# Patient Record
Sex: Male | Born: 1980 | Race: White | Hispanic: No | Marital: Married | Smoking: Never smoker
Health system: Southern US, Community
[De-identification: ages and names within clinical notes are randomized; demographics above are authoritative.]

## PROBLEM LIST (undated history)

## (undated) DIAGNOSIS — T7840XA Allergy, unspecified, initial encounter: Secondary | ICD-10-CM

## (undated) DIAGNOSIS — J4540 Moderate persistent asthma, uncomplicated: Secondary | ICD-10-CM

## (undated) HISTORY — DX: Moderate persistent asthma, uncomplicated: J45.40

## (undated) HISTORY — DX: Allergy, unspecified, initial encounter: T78.40XA

---
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
## 2004-06-28 ENCOUNTER — Emergency Department (HOSPITAL_COMMUNITY): Admission: EM | Admit: 2004-06-28 | Discharge: 2004-06-28 | Payer: Self-pay | Admitting: Emergency Medicine

## 2005-09-10 ENCOUNTER — Emergency Department (HOSPITAL_COMMUNITY): Admission: EM | Admit: 2005-09-10 | Discharge: 2005-09-10 | Payer: Self-pay | Admitting: Emergency Medicine

## 2008-05-25 ENCOUNTER — Emergency Department (HOSPITAL_COMMUNITY): Admission: EM | Admit: 2008-05-25 | Discharge: 2008-05-25 | Payer: Self-pay | Admitting: Emergency Medicine

## 2008-06-12 ENCOUNTER — Ambulatory Visit (HOSPITAL_COMMUNITY): Admission: RE | Admit: 2008-06-12 | Discharge: 2008-06-12 | Payer: Self-pay | Admitting: Gastroenterology

## 2009-08-23 ENCOUNTER — Emergency Department (HOSPITAL_BASED_OUTPATIENT_CLINIC_OR_DEPARTMENT_OTHER): Admission: EM | Admit: 2009-08-23 | Discharge: 2009-08-23 | Payer: Self-pay | Admitting: Emergency Medicine

## 2010-03-25 LAB — RAPID STREP SCREEN (MED CTR MEBANE ONLY): Streptococcus, Group A Screen (Direct): POSITIVE — AB

## 2010-04-19 LAB — DIFFERENTIAL
Basophils Relative: 0 % (ref 0–1)
Lymphs Abs: 1 10*3/uL (ref 0.7–4.0)
Monocytes Absolute: 0.5 10*3/uL (ref 0.1–1.0)
Monocytes Relative: 9 % (ref 3–12)
Neutro Abs: 4.4 10*3/uL (ref 1.7–7.7)

## 2010-04-19 LAB — POCT I-STAT, CHEM 8
BUN: 12 mg/dL (ref 6–23)
Calcium, Ion: 1.19 mmol/L (ref 1.12–1.32)
Chloride: 104 mEq/L (ref 96–112)
Creatinine, Ser: 1.1 mg/dL (ref 0.4–1.5)
Glucose, Bld: 90 mg/dL (ref 70–99)
HCT: 44 % (ref 39.0–52.0)
Potassium: 3.4 mEq/L — ABNORMAL LOW (ref 3.5–5.1)

## 2010-04-19 LAB — HEPATIC FUNCTION PANEL
ALT: 15 U/L (ref 0–53)
AST: 16 U/L (ref 0–37)
Alkaline Phosphatase: 58 U/L (ref 39–117)
Bilirubin, Direct: 0.2 mg/dL (ref 0.0–0.3)
Indirect Bilirubin: 0.7 mg/dL (ref 0.3–0.9)
Total Bilirubin: 0.9 mg/dL (ref 0.3–1.2)

## 2010-04-19 LAB — URINE MICROSCOPIC-ADD ON

## 2010-04-19 LAB — CBC
Hemoglobin: 14.8 g/dL (ref 13.0–17.0)
MCHC: 34.7 g/dL (ref 30.0–36.0)
MCV: 89.1 fL (ref 78.0–100.0)
RBC: 4.8 MIL/uL (ref 4.22–5.81)
WBC: 5.9 10*3/uL (ref 4.0–10.5)

## 2010-04-19 LAB — URINALYSIS, ROUTINE W REFLEX MICROSCOPIC
Ketones, ur: 40 mg/dL — AB
Nitrite: NEGATIVE
Protein, ur: 30 mg/dL — AB

## 2010-09-09 ENCOUNTER — Emergency Department (HOSPITAL_COMMUNITY)
Admission: EM | Admit: 2010-09-09 | Discharge: 2010-09-09 | Disposition: A | Discharge status: Home / Self Care | Payer: Self-pay | Attending: Emergency Medicine | Admitting: Emergency Medicine

## 2010-09-09 DIAGNOSIS — M79609 Pain in unspecified limb: Secondary | ICD-10-CM | POA: Insufficient documentation

## 2010-09-09 DIAGNOSIS — M779 Enthesopathy, unspecified: Secondary | ICD-10-CM | POA: Insufficient documentation

## 2011-02-11 ENCOUNTER — Encounter (HOSPITAL_COMMUNITY): Payer: Self-pay | Admitting: Emergency Medicine

## 2011-02-11 ENCOUNTER — Emergency Department (HOSPITAL_COMMUNITY)
Admission: EM | Admit: 2011-02-11 | Discharge: 2011-02-11 | Disposition: A | Discharge status: Home / Self Care | Payer: Self-pay | Attending: Emergency Medicine | Admitting: Emergency Medicine

## 2011-02-11 DIAGNOSIS — J069 Acute upper respiratory infection, unspecified: Secondary | ICD-10-CM | POA: Insufficient documentation

## 2011-02-11 DIAGNOSIS — J3489 Other specified disorders of nose and nasal sinuses: Secondary | ICD-10-CM | POA: Insufficient documentation

## 2011-02-11 DIAGNOSIS — R05 Cough: Secondary | ICD-10-CM | POA: Insufficient documentation

## 2011-02-11 DIAGNOSIS — R059 Cough, unspecified: Secondary | ICD-10-CM | POA: Insufficient documentation

## 2011-02-11 MED ORDER — ALBUTEROL SULFATE HFA 108 (90 BASE) MCG/ACT IN AERS
4.0000 | INHALATION_SPRAY | Freq: Once | RESPIRATORY_TRACT | Status: AC
  Administered 2011-02-11: 4 via RESPIRATORY_TRACT
  Filled 2011-02-11: qty 6.7

## 2011-02-11 NOTE — ED Notes (Signed)
Patient with week history of URI symptoms.  Now with more congestion, cough and chest congestion, having trouble catching his breath.

## 2011-02-11 NOTE — ED Provider Notes (Signed)
History     CSN: [id number]  Arrival date & time 02/11/11  2103   First MD Initiated Contact with Patient 02/11/11 2153      Chief Complaint  Patient presents with  . Nasal Congestion    (Consider location/radiation/quality/duration/timing/severity/associated sxs/prior treatment) Patient is a 31 y.o. male presenting with URI. The history is provided by the patient.  URI The primary symptoms include cough. Primary symptoms do not include fever, ear pain, sore throat, abdominal pain, nausea, vomiting or rash. The current episode started 6 to 7 days ago. This is a new problem. The problem has been gradually worsening.  The cough is productive. The sputum is yellow. Cough worsened by: cold.  The onset of the illness is associated with exposure to sick contacts. Symptoms associated with the illness include sinus pressure and congestion. The following treatments were addressed: Acetaminophen was ineffective. A decongestant was ineffective.    History reviewed. No pertinent past medical history.  History reviewed. No pertinent past surgical history.  No family history on file.  History  Substance Use Topics  . Smoking status: Never Smoker   . Smokeless tobacco: Not on file  . Alcohol Use: Yes     occasional      Review of Systems  Constitutional: Negative for fever.  HENT: Positive for congestion and sinus pressure. Negative for ear pain, sore throat, facial swelling and trouble swallowing.   Respiratory: Positive for cough and chest tightness. Negative for shortness of breath.   Cardiovascular: Negative for chest pain.  Gastrointestinal: Negative for nausea, vomiting, abdominal pain and diarrhea.  Genitourinary: Negative for difficulty urinating.  Skin: Negative for rash.  All other systems reviewed and are negative.    Allergies  Review of patient's allergies indicates no known allergies.  Home Medications  No current outpatient prescriptions on file.  BP 118/87   Pulse 69  Temp(Src) 98.6 F (37 C) (Oral)  Resp 16  SpO2 96%  Physical Exam  Nursing note and vitals reviewed. Constitutional: He is oriented to person, place, and time. He appears well-developed and well-nourished. No distress.  HENT:  Head: Normocephalic and atraumatic.  Nose: Right sinus exhibits maxillary sinus tenderness. Left sinus exhibits maxillary sinus tenderness.  Mouth/Throat: Oropharynx is clear and moist.  Eyes: Conjunctivae are normal. Pupils are equal, round, and reactive to light. No scleral icterus.  Neck: Normal range of motion. Neck supple.  Cardiovascular: Normal rate, regular rhythm, normal heart sounds and intact distal pulses.   No murmur heard. Pulmonary/Chest: Effort normal and breath sounds normal. No stridor. No respiratory distress. He has no wheezes. He has no rales.  Abdominal: Soft. He exhibits no distension. There is no tenderness.  Musculoskeletal: Normal range of motion. He exhibits no edema.  Neurological: He is alert and oriented to person, place, and time.  Skin: Skin is warm and dry. No rash noted.  Psychiatric: He has a normal mood and affect. His behavior is normal.    ED Course  Procedures (including critical care time)  Labs Reviewed - No data to display No results found.   1. Upper respiratory infection       MDM  31 yo male with one week of URI symptoms and cough.  Worse today with chest tightness while exercising in the cold weather.  Well appearing.  Lungs clear.  No fevers.  Sick contacts.  Given albuterol secondary to chest tightness with improvement in his symptoms.  DC'd home in good condition.  Warnell Forester, MD 02/11/11 [phone]

## 2011-02-12 NOTE — ED Provider Notes (Signed)
I saw and evaluated the patient, reviewed the resident's note and I agree with the findings and plan. Patient with URI symptoms and tightness in the chest due to constriction of the airway. Sats are within normal limits and after an inhaler he is feeling much better. He is well-appearing in feel he is safe to go home with clear breath sounds.  Gwyneth Sprout, MD 02/12/11 [phone]

## 2012-08-02 ENCOUNTER — Emergency Department (HOSPITAL_COMMUNITY)
Admission: EM | Admit: 2012-08-02 | Discharge: 2012-08-02 | Disposition: A | Discharge status: Home / Self Care | Payer: Managed Care, Other (non HMO) | Attending: Emergency Medicine | Admitting: Emergency Medicine

## 2012-08-02 ENCOUNTER — Encounter (HOSPITAL_COMMUNITY): Payer: Self-pay | Admitting: Emergency Medicine

## 2012-08-02 DIAGNOSIS — M79604 Pain in right leg: Secondary | ICD-10-CM

## 2012-08-02 DIAGNOSIS — G8911 Acute pain due to trauma: Secondary | ICD-10-CM | POA: Insufficient documentation

## 2012-08-02 DIAGNOSIS — M79609 Pain in unspecified limb: Secondary | ICD-10-CM | POA: Insufficient documentation

## 2012-08-02 MED ORDER — KETOROLAC TROMETHAMINE 60 MG/2ML IM SOLN
60.0000 mg | Freq: Once | INTRAMUSCULAR | Status: AC
  Administered 2012-08-02: 60 mg via INTRAMUSCULAR
  Filled 2012-08-02: qty 2

## 2012-08-02 NOTE — ED Notes (Signed)
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 Pt was seen by urgent care and chiropractor with no relief. Pt states pain much worse today, does have appt with Ortho Monday [facility])

## 2012-08-02 NOTE — Progress Notes (Signed)
Patient confirms his pcp is the [facility] in [place].  Patient does not see any particular doctor there.

## 2012-08-02 NOTE — ED Provider Notes (Signed)
Medical screening examination/treatment/procedure(s) were performed by non-physician practitioner and as supervising physician I was immediately available for consultation/collaboration.   Tila Millirons, MD 08/02/12 2327 

## 2012-08-02 NOTE — ED Provider Notes (Signed)
CSN: [id number]     Arrival date & time 08/02/12  1958 History    This chart was scribed for Rolan Bucco, MD, by Frederik Pear, ED scribe. The patient was seen in room WTR9/WTR9 and the patient's care was started at 2027.    First MD Initiated Contact with Patient 08/02/12 2027     Chief Complaint  Patient presents with  . Leg Injury   (Consider location/radiation/quality/duration/timing/severity/associated sxs/prior Treatment) The history is provided by the patient and medical records. No language interpreter was used.    HPI Comments: Andrew Freeman is a 32 y.o. 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 He reports the worsening pain is aggravated by positional changes, but changing from a sitting to standing position after more than 10 minutes changes the pain from an 8 to a 6. He denies ecchymosis, bulges, lumps, or masses in the area. He also complains of an antalgic gait. Her reports he was seen by [facility] earlier in the week and was given Robaxin and ibuprofen. He was advised to follow up if the pain continued. He was seen yesterday by a chiropractor, which offered no relief. He reports he has an appointment with an orthopedist in 3 days. Her reports he has treated the pain with ibuprofen at home since his [facility] visit. He reports he came to the ED because he is concerned the pain is not improving, and he is concerned that the pain will be aggravated by his work.  History reviewed. No pertinent past medical history. History reviewed. No pertinent past surgical history. No family history on file. History  Substance Use Topics  . Smoking status: Never Smoker   . Smokeless tobacco: Not on file  . Alcohol Use: Yes     Comment: occasional    Review of Systems A complete 10 system review of systems was obtained  and all systems are negative except as noted in the HPI and PMH.  Allergies  Review of patient's allergies indicates no known allergies.  Home Medications   Current Outpatient Rx  Name  Route  Sig  Dispense  Refill  . ibuprofen (ADVIL,MOTRIN) 200 MG tablet   Oral   Take 800 mg by mouth every 6 (six) hours as needed for pain (pain).         . methocarbamol (ROBAXIN) 750 MG tablet   Oral   Take 750 mg by mouth 4 (four) times daily as needed (muscle pain).          BP 129/90  Pulse 68  Temp(Src) 98.3 F (36.8 C) (Oral)  Resp 16  Ht 6' (1.829 m)  Wt 265 lb (120.203 kg)  BMI 35.93 kg/m2  SpO2 100% Physical Exam  Nursing note and vitals reviewed. Constitutional: He is oriented to person, place, and time. He appears well-developed and well-nourished. No distress.  HENT:  Head: Normocephalic and atraumatic.  Eyes: EOM are normal. Pupils are equal, round, and reactive to light.  Neck: Normal range of motion. Neck supple. No tracheal deviation present.  Cardiovascular: Normal rate, regular rhythm and normal heart sounds.  Exam reveals no gallop and no friction rub.   No murmur heard. Pulmonary/Chest: Effort normal. No respiratory distress. He has no wheezes. He has no rales. He exhibits no tenderness.  Abdominal: Soft. He exhibits no distension.  Musculoskeletal: Normal  range of motion. He exhibits no edema.  Moves all extremities. ROM and strength are 5/5. Pain increased with hamstring flexion. Ambulatory.  Neurological: He is alert and oriented to person, place, and time.  Skin: Skin is warm and dry.  No contusions. No ecchymosis. No masses.  Psychiatric: He has a normal mood and affect. His behavior is normal.    ED Course   Procedures (including critical care time)  DIAGNOSTIC STUDIES: Oxygen Saturation is 100% on room air, normal by my interpretation.    COORDINATION OF CARE:  20:45- Discussed planned course of treatment with the patient, including IV Toradol in  the ED and treatment at home with 800 mg of ibuprofen t.i.d, icing the area, rest, and keeping his appointment with ortho, who is agreeable at this time.  Labs Reviewed - No data to display No results found. 1. Leg pain, right     MDM  Patient with musculoskeletal leg pain. Will discharge to home. Recommend orthopedic followup. Rice therapy. And ibuprofen. Patient is stable and ready for discharge. He is ambulatory.  I personally performed the services described in this documentation, which was scribed in my presence. The recorded information has been reviewed and is accurate.     Roxy Horseman, PA-C 08/02/12 2216

## 2013-07-28 DIAGNOSIS — F32A Depression, unspecified: Secondary | ICD-10-CM | POA: Insufficient documentation

## 2013-07-28 DIAGNOSIS — F419 Anxiety disorder, unspecified: Secondary | ICD-10-CM | POA: Insufficient documentation

## 2015-02-15 ENCOUNTER — Ambulatory Visit (INDEPENDENT_AMBULATORY_CARE_PROVIDER_SITE_OTHER): Payer: Managed Care, Other (non HMO) | Admitting: Allergy and Immunology

## 2015-02-15 ENCOUNTER — Encounter: Payer: Self-pay | Admitting: Allergy and Immunology

## 2015-02-15 VITALS — BP 120/80 | HR 76 | Temp 98.2°F | Resp 16

## 2015-02-15 DIAGNOSIS — J4540 Moderate persistent asthma, uncomplicated: Secondary | ICD-10-CM

## 2015-02-15 DIAGNOSIS — J3089 Other allergic rhinitis: Secondary | ICD-10-CM | POA: Diagnosis not present

## 2015-02-15 DIAGNOSIS — J4541 Moderate persistent asthma with (acute) exacerbation: Secondary | ICD-10-CM | POA: Diagnosis not present

## 2015-02-15 HISTORY — DX: Moderate persistent asthma, uncomplicated: J45.40

## 2015-02-15 MED ORDER — AZELASTINE HCL 0.15 % NA SOLN: 2.0000 | Freq: Every day | NASAL | Status: DC

## 2015-02-15 MED ORDER — ALBUTEROL SULFATE HFA 108 (90 BASE) MCG/ACT IN AERS: 2.0000 | INHALATION_SPRAY | RESPIRATORY_TRACT | Status: AC | PRN

## 2015-02-15 MED ORDER — OLOPATADINE HCL 0.7 % OP SOLN: 1.0000 [drp] | Freq: Every day | OPHTHALMIC | Status: DC

## 2015-02-15 MED ORDER — LEVOCETIRIZINE DIHYDROCHLORIDE 5 MG PO TABS: 5.0000 mg | ORAL_TABLET | Freq: Every evening | ORAL | Status: DC

## 2015-02-15 MED ORDER — BECLOMETHASONE DIPROPIONATE 80 MCG/ACT IN AERS: 2.0000 | INHALATION_SPRAY | Freq: Two times a day (BID) | RESPIRATORY_TRACT | Status: DC

## 2015-02-15 NOTE — Assessment & Plan Note (Signed)
   Continue appropriate allergen avoidance measures.  Restart azelastine, levocetirizine, and olopatadine eyedrops as needed.  Refill prescriptions have been provided.  If allergen avoidance measures and medications fail to adequately relieve symptoms, aeroallergen immunotherapy will be considered.

## 2015-02-15 NOTE — Assessment & Plan Note (Addendum)
   A prescription has been provided for Qvar (beclomethasone) 80 g, 2 inhalations twice a day.  To maximize pulmonary deposition, a spacer has been provided along with instructions for its proper administration with an HFA inhaler.  A prescription has been provided for montelukast 10 mg daily at bedtime.  The patient has been asked to contact me if his symptoms persist or progress. Otherwise, he may return for follow up in 1 month.

## 2015-02-15 NOTE — Progress Notes (Signed)
Follow-up Note  RE: Andrew Freeman MRN: [id number] DOB: 15-Nov-1980 Date of Office Visit: 02/15/2015  Primary care provider: No PCP Per Patient Referring provider: No ref. provider found  History of present illness: HPI Comments: Andrew Freeman is a 35 y.o. male with seasonal and perennial allergic rhinoconjunctivitis who presents today for follow up.  He was most recently seen in this office 05/05/2014.  He reports that approximately 2 months ago he was diagnosed with severe case of bronchitis and treated with antibiotics.  Since that time, he frequently experiences coughing, dyspnea, and wheezing.  The symptoms are triggered by cold air exposure, hot air exposure, and exercise.  He experiences lower respiratory symptoms on a daily basis.  He denies nocturnal awakenings due to lower respiratory symptoms.  He notes that he experiences significant symptom relief when he uses his son's albuterol rescue inhaler.  He reports that he ran out of his prescribed allergy medications and has been experiencing nasal congestion, rhinorrhea, sneezing, and ocular pruritus.   Assessment and plan: Moderate persistent asthma  A prescription has been provided for Qvar (beclomethasone) 80 g, 2 inhalations twice a day.  To maximize pulmonary deposition, a spacer has been provided along with instructions for its proper administration with an HFA inhaler.  A prescription has been provided for montelukast 10 mg daily at bedtime.  The patient has been asked to contact me if his symptoms persist or progress. Otherwise, he may return for follow up in 1 month.  Allergic rhinitis  Continue appropriate allergen avoidance measures.  Restart azelastine, levocetirizine, and olopatadine eyedrops as needed.  Refill prescriptions have been provided.  If allergen avoidance measures and medications fail to adequately relieve symptoms, aeroallergen immunotherapy will be considered.    Meds ordered this  encounter  Medications  . levocetirizine (XYZAL) 5 MG tablet    Sig: Take 1 tablet (5 mg total) by mouth every evening.    Dispense:  30 tablet    Refill:  5  . beclomethasone (QVAR) 80 MCG/ACT inhaler    Sig: Inhale 2 puffs into the lungs 2 (two) times daily.    Dispense:  1 Inhaler    Refill:  4  . Olopatadine HCl (PAZEO) 0.7 % SOLN    Sig: Apply 1 drop to eye daily. Reported on 02/15/2015    Dispense:  1 Bottle    Refill:  5  . Azelastine HCl (ASTEPRO) 0.15 % SOLN    Sig: Place 2 sprays into the nose daily. Reported on 02/15/2015    Dispense:  30 mL    Refill:  4  . albuterol (PROAIR HFA) 108 (90 Base) MCG/ACT inhaler    Sig: Inhale 2 puffs into the lungs every 4 (four) hours as needed for wheezing or shortness of breath.    Dispense:  1 Inhaler    Refill:  1    Diagnositics: Spirometry reveals FVC of 4.33 L and an FEV1 of 3.96 L with 340 mL postbronchodilator improvement.    Physical examination: Blood pressure 120/80, pulse 76, temperature 98.2 F (36.8 C), temperature source Oral, resp. rate 16, SpO2 96 %.  General: Alert, interactive, in no acute distress. HEENT: TMs pearly gray, turbinates edematous and pale with clear discharge, post-pharynx moderately erythematous. Neck: Supple without lymphadenopathy. Lungs: Clear to auscultation without wheezing, rhonchi or rales. CV: Normal S1, S2 without murmurs. Skin: Warm and dry, without lesions or rashes.  The following portions of the patient's history were reviewed and updated as appropriate: allergies, current  medications, past family history, past medical history, past social history, past surgical history and problem list.    Medication List       This list is accurate as of: 02/15/15  5:18 PM.  Always use your most recent med list.               albuterol 108 (90 Base) MCG/ACT inhaler  Commonly known as:  PROAIR HFA  Inhale 2 puffs into the lungs every 4 (four) hours as needed for wheezing or shortness of breath.      Azelastine HCl 0.15 % Soln  Commonly known as:  ASTEPRO  Place 2 sprays into the nose daily. Reported on 02/15/2015     beclomethasone 80 MCG/ACT inhaler  Commonly known as:  QVAR  Inhale 2 puffs into the lungs 2 (two) times daily.     BENADRYL ALLERGY 25 mg capsule  Generic drug:  diphenhydrAMINE  Take 25 mg by mouth every 6 (six) hours as needed.     ibuprofen 200 MG tablet  Commonly known as:  ADVIL,MOTRIN  Take 800 mg by mouth every 6 (six) hours as needed for pain (pain). Reported on 02/15/2015     levocetirizine 5 MG tablet  Commonly known as:  XYZAL  Take 5 mg by mouth every evening. Reported on 02/15/2015     levocetirizine 5 MG tablet  Commonly known as:  XYZAL  Take 1 tablet (5 mg total) by mouth every evening.     methocarbamol 750 MG tablet  Commonly known as:  ROBAXIN  Take 750 mg by mouth 4 (four) times daily as needed (muscle pain). Reported on 02/15/2015     Olopatadine HCl 0.7 % Soln  Commonly known as:  PAZEO  Apply 1 drop to eye daily. Reported on 02/15/2015        No Known Allergies  Review of systems: Constitutional: Negative for fever, chills and weight loss.  HENT: Negative for nosebleeds.   Positive for nasal congestion, rhinorrhea, sneezing. Eyes: Negative for blurred vision.  Positive for ocular pruritus. Respiratory: Negative for hemoptysis.   Positive for coughing, dyspnea, wheezing. Cardiovascular: Negative for chest pain.  Gastrointestinal: Negative for diarrhea and constipation.  Genitourinary: Negative for dysuria.  Musculoskeletal: Negative for myalgias and joint pain.  Neurological: Negative for dizziness.  Endo/Heme/Allergies: Does not bruise/bleed easily.   Past Medical History  Diagnosis Date  . Moderate persistent asthma 02/15/2015    Family History  Problem Relation Age of Onset  . Allergic rhinitis Neg Hx   . Angioedema Neg Hx   . Asthma Neg Hx   . Eczema Neg Hx   . Immunodeficiency Neg Hx   . Urticaria Neg Hx      Social History   Social History  . Marital Status: Married    Spouse Name: N/A  . Number of Children: N/A  . Years of Education: N/A   Occupational History  . Not on file.   Social History Main Topics  . Smoking status: Never Smoker   . Smokeless tobacco: Not on file  . Alcohol Use: Yes     Comment: occasional  . Drug Use: No  . Sexual Activity: Not on file   Other Topics Concern  . Not on file   Social History Narrative    I appreciate the opportunity to take part in this Evan's care. Please do not hesitate to contact me with questions.  Sincerely,   R. Jorene Guest, MD

## 2015-02-15 NOTE — Patient Instructions (Addendum)
Moderate persistent asthma  A prescription has been provided for Qvar (beclomethasone) 80 g, 2 inhalations twice a day.  To maximize pulmonary deposition, a spacer has been provided along with instructions for its proper administration with an HFA inhaler.  A prescription has been provided for montelukast 10 mg daily at bedtime.  The patient has been asked to contact me if his symptoms persist or progress. Otherwise, he may return for follow up in 1 month.  Allergic rhinitis  Continue appropriate allergen avoidance measures.  Restart azelastine, levocetirizine, and olopatadine eyedrops as needed.  Refill prescriptions have been provided.  If allergen avoidance measures and medications fail to adequately relieve symptoms, aeroallergen immunotherapy will be considered.    Return in about 4 weeks (around 03/15/2015), or if symptoms worsen or fail to improve.

## 2016-05-09 ENCOUNTER — Ambulatory Visit: Payer: Managed Care, Other (non HMO) | Admitting: Allergy and Immunology

## 2016-05-09 DIAGNOSIS — J309 Allergic rhinitis, unspecified: Secondary | ICD-10-CM

## 2016-07-03 NOTE — Progress Notes (Signed)
Andrew Freeman is a 36 y.o. male here to Establish Care and Annual Physical.  I acted as a Neurosurgeon for [employer], PA-C Corky Mull, LPN  History of Present Illness:   Chief Complaint  Patient presents with  . Establish Care    BC/BS  . Annual Exam    Acute Concerns: None  Chronic Issues: Asthma -- well controlled, has a flare about once a year, has nebulizer machine at home and may use 1-2 times a year, uses albuterol once or twice a year -- does not need refills Allergy -- claritin daily, well controlled IBS -- has been evaluated in the past by [facility] but was not consistent with follow-up, he took a medication to help with his IBS but it caused "anger issues", he goes to the bathroom 8-10 times a day -- describes BMs as "normal texture and regular sized", no hemorrhoids currently but does have a history of this, no issues with blood from rectum. 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 He does admit that he was taking more of the immodium at one point but is trying to decrease it. He has been taking probiotics. Back pain -- has been told that he has bulging discs, stepped off an ambulance wrong a few years ago and has had problems since, has gotten injections at [facility] and follows up there prn, denies any acute issues with his back presently  Health Maintenance: Immunizations -- up to date Colonoscopy -- no hx of colonoscopy PSA -- no issues with bladder/nocturia Diet -- no fish, but eats all food groups, no fried foods, sweetened tea Caffeine intake -- occasional caffeinated soda, no energy drinks Sleep habits -- naps often due to night shift, does have concerns for sleep study Exercise -- no scheduled exercise Weight -- Weight: 283 lb 6.1 oz (128.5 kg) -- 250-260 lb is where he is more comfortable Mood -- no issues, small bout of depression a long time ago (Paxil)  but denies any currently  Depression screen [facility] 2/9 07/04/2016  Decreased Interest 0  Down, Depressed, Hopeless 0  PHQ - 2 Score 0    No flowsheet data found.  Other providers/specialists: GI doctor -- saw someone through [facility] but he cannot recall who, he was lost to follow-up Orthopedist -- Murphy-Wainer prn for his bulging disc No dentist or eye doctor   Past Medical History:  Diagnosis Date  . Allergy    seasonal allergies  . Moderate persistent asthma 02/15/2015     Social History   Social History  . Marital status: Married    Spouse name: N/A  . Number of children: N/A  . Years of education: N/A   Occupational History  . Not on file.   Social History Main Topics  . Smoking status: Never Smoker  . Smokeless tobacco: Never Used  . Alcohol use Yes     Comment: socially  . Drug use: No  . Sexual activity: Yes   Other Topics Concern  . Not on file   Social History Narrative   EMS    Past Surgical History:  Procedure Laterality Date  . WISDOM TOOTH EXTRACTION  1996    Family History  Problem Relation Age of Onset  . Allergic rhinitis Neg Hx   . Angioedema Neg Hx   . Asthma Neg Hx   . Eczema Neg Hx   .  Immunodeficiency Neg Hx   . Urticaria Neg Hx   . Prostate cancer Neg Hx   . Colon cancer Neg Hx     No Known Allergies   Current Medications:   Current Outpatient Prescriptions:  .  albuterol (PROAIR HFA) 108 (90 Base) MCG/ACT inhaler, Inhale 2 puffs into the lungs every 4 (four) hours as needed for wheezing or shortness of breath., Disp: 1 Inhaler, Rfl: 1 .  diphenhydrAMINE (BENADRYL ALLERGY) 25 mg capsule, Take 25 mg by mouth every 6 (six) hours as needed., Disp: , Rfl:  .  ibuprofen (ADVIL,MOTRIN) 200 MG tablet, Take 800 mg by mouth every 6 (six) hours as needed for pain (pain). Reported on 02/15/2015, Disp: , Rfl:  .  loratadine (CLARITIN) 10 MG tablet, Take 10 mg by mouth as needed for allergies., Disp: , Rfl:  .  Probiotic Product  (PROBIOTIC DAILY PO), Take 1 tablet by mouth daily., Disp: , Rfl:    Review of Systems:   Review of Systems  Constitutional: Negative for chills, fever, malaise/fatigue and weight loss.  HENT: Negative for hearing loss, sinus pain and sore throat.   Eyes: Negative for blurred vision.  Respiratory: Negative for cough and shortness of breath.   Cardiovascular: Negative for chest pain, palpitations and leg swelling.  Gastrointestinal: Negative for abdominal pain, constipation, diarrhea, heartburn, nausea and vomiting.  Genitourinary: Negative for dysuria, frequency and urgency.  Musculoskeletal: Positive for back pain. Negative for myalgias and neck pain.  Skin: Negative for itching and rash.  Neurological: Negative for dizziness, tingling, seizures, loss of consciousness and headaches.  Endo/Heme/Allergies: Negative for polydipsia.  Psychiatric/Behavioral: Negative for depression. The patient is not nervous/anxious.     Vitals:   Vitals:   07/04/16 0943  BP: 140/86  Pulse: 75  Temp: 98.2 F (36.8 C)  TempSrc: Oral  SpO2: 96%  Weight: 283 lb 6.1 oz (128.5 kg)  Height: 5' 11.25" (1.81 m)     Body mass index is 39.25 kg/m.  Physical Exam:   Physical Exam  Constitutional: He appears well-developed. He is cooperative.  Non-toxic appearance. He does not have a sickly appearance. He does not appear ill. No distress.  HENT:  Head: Normocephalic and atraumatic.  Right Ear: Tympanic membrane normal. Tympanic membrane is not erythematous, not retracted and not bulging.  Left Ear: Tympanic membrane normal. Tympanic membrane is not erythematous, not retracted and not bulging.  Nose: Nose normal.  Mouth/Throat: Uvula is midline and oropharynx is clear and moist.  Eyes: Conjunctivae, EOM and lids are normal. Pupils are equal, round, and reactive to light.  Neck: Trachea normal.  Cardiovascular: Normal rate, regular rhythm, S1 normal, S2 normal, normal heart sounds and normal pulses.    Pulmonary/Chest: Effort normal and breath sounds normal.  Abdominal: Normal appearance and bowel sounds are normal. There is no tenderness.  Genitourinary:  Genitourinary Comments: Deferred.  Lymphadenopathy:    He has no cervical adenopathy.  Neurological: He is alert.  Skin: Skin is warm, dry and intact.  Psychiatric: He has a normal mood and affect. His speech is normal and behavior is normal. Thought content normal.  Nursing note and vitals reviewed.   Assessment and Plan:    Fayrene FearingJames was seen today for establish care and annual exam.  Diagnoses and all orders for this visit:  Routine general medical examination at a health care facility Today patient counseled on age appropriate routine health concerns for screening and prevention, each reviewed and up to date or declined. Immunizations reviewed and  up to date or declined. Labs ordered and reviewed. Risk factors for depression reviewed and negative. Hearing function and visual acuity are intact. ADLs screened and addressed as needed. Functional ability and level of safety reviewed and appropriate. Education, counseling and referrals performed based on assessed risks today. Patient provided with a copy of personalized plan for preventive services. -     CBC with Differential/Platelet -     Comprehensive metabolic panel  Obesity (BMI 30-39.9) Discussed diet and exercise. Patient is not really willing to make lifestyle changes at this time. Will check routine labs as well as HgbA1c and TSH. He does have concern for sleep apnea, will order home sleep test. -     Hemoglobin A1c -     TSH -     Home sleep test; Future  Mild intermittent asthma without complication Well-controlled. Albuterol prn. Follow-up if using Albuterol inhaler multiple times a week.  Irritable bowel syndrome, unspecified type Uncontrolled. Continue probiotic. He has tried a few medications in the past but cannot remember what they were. Advised continued  reduction of Immodium as able and increasing fiber in diet. Diet is very high in fat. -     Ambulatory referral to Gastroenterology  Seasonal allergic rhinitis, unspecified trigger Well-controlled. Continue daily Claritin.  Encounter for screening for HIV -     HIV antibody  Encounter for screening for lipid disorder -     Lipid panel  Daytime somnolence Concern for sleep apnea, will order home sleep test. -     Home sleep test; Future  Chronic low back pain Well controlled. Work on weight loss. Murphy-Wainer prn.   . Reviewed expectations re: course of current medical issues. . Discussed self-management of symptoms. . Outlined signs and symptoms indicating need for more acute intervention. . Patient verbalized understanding and all questions were answered. . See orders for this visit as documented in the electronic medical record. . Patient received an After-Visit Summary.  CMA or LPN served as scribe during this visit. History, Physical, and Plan performed by medical provider. Documentation and orders reviewed and attested to.  Jarold Motto, PA-C

## 2016-07-04 ENCOUNTER — Encounter: Payer: Self-pay | Admitting: Gastroenterology

## 2016-07-04 ENCOUNTER — Ambulatory Visit (INDEPENDENT_AMBULATORY_CARE_PROVIDER_SITE_OTHER): Payer: BLUE CROSS/BLUE SHIELD | Admitting: Physician Assistant

## 2016-07-04 ENCOUNTER — Encounter: Payer: Self-pay | Admitting: Physician Assistant

## 2016-07-04 VITALS — BP 140/86 | HR 75 | Temp 98.2°F | Ht 71.25 in | Wt 283.4 lb

## 2016-07-04 DIAGNOSIS — J4520 Mild intermittent asthma, uncomplicated: Secondary | ICD-10-CM

## 2016-07-04 DIAGNOSIS — Z0000 Encounter for general adult medical examination without abnormal findings: Secondary | ICD-10-CM

## 2016-07-04 DIAGNOSIS — R400 Somnolence: Secondary | ICD-10-CM | POA: Diagnosis not present

## 2016-07-04 DIAGNOSIS — E669 Obesity, unspecified: Secondary | ICD-10-CM | POA: Diagnosis not present

## 2016-07-04 DIAGNOSIS — Z13220 Encounter for screening for lipoid disorders: Secondary | ICD-10-CM

## 2016-07-04 DIAGNOSIS — G8929 Other chronic pain: Secondary | ICD-10-CM

## 2016-07-04 DIAGNOSIS — J302 Other seasonal allergic rhinitis: Secondary | ICD-10-CM

## 2016-07-04 DIAGNOSIS — K589 Irritable bowel syndrome without diarrhea: Secondary | ICD-10-CM | POA: Diagnosis not present

## 2016-07-04 DIAGNOSIS — Z0001 Encounter for general adult medical examination with abnormal findings: Secondary | ICD-10-CM

## 2016-07-04 DIAGNOSIS — M545 Low back pain: Secondary | ICD-10-CM

## 2016-07-04 DIAGNOSIS — Z114 Encounter for screening for human immunodeficiency virus [HIV]: Secondary | ICD-10-CM

## 2016-07-04 LAB — COMPREHENSIVE METABOLIC PANEL
ALT: 32 U/L (ref 0–53)
AST: 22 U/L (ref 0–37)
Albumin: 4.6 g/dL (ref 3.5–5.2)
Alkaline Phosphatase: 61 U/L (ref 39–117)
BUN: 13 mg/dL (ref 6–23)
CALCIUM: 9.9 mg/dL (ref 8.4–10.5)
CHLORIDE: 104 meq/L (ref 96–112)
CO2: 31 meq/L (ref 19–32)
Creatinine, Ser: 1.12 mg/dL (ref 0.40–1.50)
GFR: 79.57 mL/min (ref >60.00)
GLUCOSE: 89 mg/dL (ref 70–99)
POTASSIUM: 4.1 meq/L (ref 3.5–5.1)
Sodium: 141 mEq/L (ref 135–145)
Total Bilirubin: 0.5 mg/dL (ref 0.2–1.2)
Total Protein: 7.6 g/dL (ref 6.0–8.3)

## 2016-07-04 LAB — CBC WITH DIFFERENTIAL/PLATELET
BASOS PCT: 0.7 % (ref 0.0–3.0)
Basophils Absolute: 0.1 10*3/uL (ref 0.0–0.1)
Eosinophils Absolute: 0.2 10*3/uL (ref 0.0–0.7)
Eosinophils Relative: 2.6 % (ref 0.0–5.0)
HEMATOCRIT: 41.9 % (ref 39.0–52.0)
Hemoglobin: 14.4 g/dL (ref 13.0–17.0)
LYMPHS ABS: 2.1 10*3/uL (ref 0.7–4.0)
LYMPHS PCT: 29.2 % (ref 12.0–46.0)
MCHC: 34.4 g/dL (ref 30.0–36.0)
MCV: 86.8 fl (ref 78.0–100.0)
MONOS PCT: 9 % (ref 3.0–12.0)
Monocytes Absolute: 0.7 10*3/uL (ref 0.1–1.0)
NEUTROS ABS: 4.3 10*3/uL (ref 1.4–7.7)
NEUTROS PCT: 58.5 % (ref 43.0–77.0)
PLATELETS: 304 10*3/uL (ref 150.0–400.0)
RBC: 4.82 Mil/uL (ref 4.22–5.81)
RDW: 14.5 % (ref 11.5–15.5)
WBC: 7.3 10*3/uL (ref 4.0–10.5)

## 2016-07-04 LAB — LIPID PANEL
CHOL/HDL RATIO: 4
Cholesterol: 157 mg/dL (ref 0–200)
HDL: 37.8 mg/dL — AB (ref >39.00)
LDL CALC: 100 mg/dL — AB (ref 0–99)
NonHDL: 119.03
TRIGLYCERIDES: 95 mg/dL (ref 0.0–149.0)
VLDL: 19 mg/dL (ref 0.0–40.0)

## 2016-07-04 LAB — HEMOGLOBIN A1C: Hgb A1c MFr Bld: 5.4 % (ref 4.6–6.5)

## 2016-07-04 LAB — TSH: TSH: 2.81 u[IU]/mL (ref 0.35–4.50)

## 2016-07-04 NOTE — Patient Instructions (Signed)
We will call you with your lab results.  You will be contacted about your referral for a sleep study and to see the GI doctor.   Health Maintenance, Male A healthy lifestyle and preventive care is important for your health and wellness. Ask your health care provider about what schedule of regular examinations is right for you. What should I know about weight and diet? Eat a Healthy Diet  Eat plenty of vegetables, fruits, whole grains, low-fat dairy products, and lean protein.  Do not eat a lot of foods high in solid fats, added sugars, or salt.  Maintain a Healthy Weight Regular exercise can help you achieve or maintain a healthy weight. You should:  Do at least 150 minutes of exercise each week. The exercise should increase your heart rate and make you sweat (moderate-intensity exercise).  Do strength-training exercises at least twice a week.  Watch Your Levels of Cholesterol and Blood Lipids  Have your blood tested for lipids and cholesterol every 5 years starting at 34 years of age. If you are at high risk for heart disease, you should start having your blood tested when you are 34 years old. You may need to have your cholesterol levels checked more often if: ? Your lipid or cholesterol levels are high. ? You are older than 34 years of age. ? You are at high risk for heart disease.  What should I know about cancer screening? Many types of cancers can be detected early and may often be prevented. Lung Cancer  You should be screened every year for lung cancer if: ? You are a current smoker who has smoked for at least 30 years. ? You are a former smoker who has quit within the past 15 years.  Talk to your health care provider about your screening options, when you should start screening, and how often you should be screened.  Colorectal Cancer  Routine colorectal cancer screening usually begins at 34 years of age and should be repeated every 5-10 years until you are 34 years  old. You may need to be screened more often if early forms of precancerous polyps or small growths are found. Your health care provider may recommend screening at an earlier age if you have risk factors for colon cancer.  Your health care provider may recommend using home test kits to check for hidden blood in the stool.  A small camera at the end of a tube can be used to examine your colon (sigmoidoscopy or colonoscopy). This checks for the earliest forms of colorectal cancer.  Prostate and Testicular Cancer  Depending on your age and overall health, your health care provider may do certain tests to screen for prostate and testicular cancer.  Talk to your health care provider about any symptoms or concerns you have about testicular or prostate cancer.  Skin Cancer  Check your skin from head to toe regularly.  Tell your health care provider about any new moles or changes in moles, especially if: ? There is a change in a mole's size, shape, or color. ? You have a mole that is larger than a pencil eraser.  Always use sunscreen. Apply sunscreen liberally and repeat throughout the day.  Protect yourself by wearing long sleeves, pants, a wide-brimmed hat, and sunglasses when outside.  What should I know about heart disease, diabetes, and high blood pressure?  If you are 7618-34 years of age, have your blood pressure checked every 3-5 years. If you are 34 years of age  or older, have your blood pressure checked every year. You should have your blood pressure measured twice-once when you are at a hospital or clinic, and once when you are not at a hospital or clinic. Record the average of the two measurements. To check your blood pressure when you are not at a hospital or clinic, you can use: ? An automated blood pressure machine at a pharmacy. ? A home blood pressure monitor.  Talk to your health care provider about your target blood pressure.  If you are between 54-58 years old, ask your  health care provider if you should take aspirin to prevent heart disease.  Have regular diabetes screenings by checking your fasting blood sugar level. ? If you are at a normal weight and have a low risk for diabetes, have this test once every three years after the age of 47. ? If you are overweight and have a high risk for diabetes, consider being tested at a younger age or more often.  A one-time screening for abdominal aortic aneurysm (AAA) by ultrasound is recommended for men aged 9-75 years who are current or former smokers. What should I know about preventing infection? Hepatitis B If you have a higher risk for hepatitis B, you should be screened for this virus. Talk with your health care provider to find out if you are at risk for hepatitis B infection. Hepatitis C Blood testing is recommended for:  Everyone born from [birth year] through 1963.  Anyone with known risk factors for hepatitis C.  Sexually Transmitted Diseases (STDs)  You should be screened each year for STDs including gonorrhea and chlamydia if: ? You are sexually active and are younger than 34 years of age. ? You are older than 34 years of age and your health care provider tells you that you are at risk for this type of infection. ? Your sexual activity has changed since you were last screened and you are at an increased risk for chlamydia or gonorrhea. Ask your health care provider if you are at risk.  Talk with your health care provider about whether you are at high risk of being infected with HIV. Your health care provider may recommend a prescription medicine to help prevent HIV infection.  What else can I do?  Schedule regular health, dental, and eye exams.  Stay current with your vaccines (immunizations).  Do not use any tobacco products, such as cigarettes, chewing tobacco, and e-cigarettes. If you need help quitting, ask your health care provider.  Limit alcohol intake to no more than 2 drinks per day. One  drink equals 12 ounces of beer, 5 ounces of wine, or 1 ounces of hard liquor.  Do not use street drugs.  Do not share needles.  Ask your health care provider for help if you need support or information about quitting drugs.  Tell your health care provider if you often feel depressed.  Tell your health care provider if you have ever been abused or do not feel safe at home. This information is not intended to replace advice given to you by your health care provider. Make sure you discuss any questions you have with your health care provider. Document Released: 06/24/2007 Document Revised: 08/25/2015 Document Reviewed: 09/29/2014 Elsevier Interactive Patient Education  [employer].

## 2016-07-05 LAB — HIV ANTIBODY (ROUTINE TESTING W REFLEX): HIV 1&2 Ab, 4th Generation: NONREACTIVE

## 2016-08-01 ENCOUNTER — Telehealth: Payer: Self-pay | Admitting: Physician Assistant

## 2016-08-01 NOTE — Telephone Encounter (Signed)
[facility] fax [facility] Dr[facility]Spears [facility]

## 2016-08-22 ENCOUNTER — Ambulatory Visit: Payer: BLUE CROSS/BLUE SHIELD | Admitting: Gastroenterology

## 2016-08-22 ENCOUNTER — Other Ambulatory Visit: Payer: Self-pay

## 2016-08-28 ENCOUNTER — Encounter: Payer: Self-pay | Admitting: Physician Assistant

## 2016-09-25 ENCOUNTER — Encounter: Payer: Self-pay | Admitting: Physician Assistant

## 2016-09-25 ENCOUNTER — Ambulatory Visit (INDEPENDENT_AMBULATORY_CARE_PROVIDER_SITE_OTHER): Payer: BLUE CROSS/BLUE SHIELD | Admitting: Physician Assistant

## 2016-09-25 VITALS — BP 120/88 | HR 93 | Temp 98.8°F | Ht 71.25 in | Wt 282.0 lb

## 2016-09-25 DIAGNOSIS — J020 Streptococcal pharyngitis: Secondary | ICD-10-CM

## 2016-09-25 LAB — POCT RAPID STREP A (OFFICE): RAPID STREP A SCREEN: POSITIVE — AB

## 2016-09-25 MED ORDER — AMOXICILLIN 875 MG PO TABS: 875.0000 mg | ORAL_TABLET | Freq: Two times a day (BID) | ORAL | 0 refills | Status: DC

## 2016-09-25 NOTE — Patient Instructions (Signed)
You have been diagnosed with strep throat --> your test was positive in the office today. Start the antibiotic today. I recommend staying out of work for the first 24 hours until antibiotic is in your system.  If you develop worsening fevers or shortness of breath please let us know.   Strep Throat Strep throat is a bacterial infection of the throat. Your health care provider may call the infection tonsillitis or pharyngitis, depending on whether there is swelling in the tonsils or at the back of the throat. Strep throat is most common during the cold months of the year in children who are 87-74 years of age, but it can happen during any season in people of any age. This infection is spread from person to person (contagious) through coughing, sneezing, or close contact. What are the causes? Strep throat is caused by the bacteria called Streptococcus pyogenes. What increases the risk? This condition is more likely to develop in:  People who spend time in crowded places where the infection can spread easily.  People who have close contact with someone who has strep throat.  What are the signs or symptoms? Symptoms of this condition include:  Fever or chills.  Redness, swelling, or pain in the tonsils or throat.  Pain or difficulty when swallowing.  White or yellow spots on the tonsils or throat.  Swollen, tender glands in the neck or under the jaw.  Red rash all over the body (rare).  How is this diagnosed? This condition is diagnosed by performing a rapid strep test or by taking a swab of your throat (throat culture test). Results from a rapid strep test are usually ready in a few minutes, but throat culture test results are available after one or two days. How is this treated? This condition is treated with antibiotic medicine. Follow these instructions at home: Medicines  Take over-the-counter and prescription medicines only as told by your health care provider.  Take your  antibiotic as told by your health care provider. Do not stop taking the antibiotic even if you start to feel better.  Have family members who also have a sore throat or fever tested for strep throat. They may need antibiotics if they have the strep infection. Eating and drinking  Do not share food, drinking cups, or personal items that could cause the infection to spread to other people.  If swallowing is difficult, try eating soft foods until your sore throat feels better.  Drink enough fluid to keep your urine clear or pale yellow. General instructions  Gargle with a salt-water mixture 3-4 times per day or as needed. To make a salt-water mixture, completely dissolve -1 tsp of salt in 1 cup of warm water.  Make sure that all household members wash their hands well.  Get plenty of rest.  Stay home from school or work until you have been taking antibiotics for 24 hours.  Keep all follow-up visits as told by your health care provider. This is important. Contact a health care provider if:  The glands in your neck continue to get bigger.  You develop a rash, cough, or earache.  You cough up a thick liquid that is green, yellow-brown, or bloody.  You have pain or discomfort that does not get better with medicine.  Your problems seem to be getting worse rather than better.  You have a fever. Get help right away if:  You have new symptoms, such as vomiting, severe headache, stiff or painful neck, chest pain,  or shortness of breath.  You have severe throat pain, drooling, or changes in your voice.  You have swelling of the neck, or the skin on the neck becomes red and tender.  You have signs of dehydration, such as fatigue, dry mouth, and decreased urination.  You become increasingly sleepy, or you cannot wake up completely.  Your joints become red or painful. This information is not intended to replace advice given to you by your health care provider. Make sure you discuss  any questions you have with your health care provider. Document Released: 12/24/1999 Document Revised: 08/25/2015 Document Reviewed: 04/20/2014 Elsevier Interactive Patient Education  2017 [employer].

## 2016-09-25 NOTE — Progress Notes (Signed)
Andrew Freeman is a 36 y.o. male here for a new problem.  I acted as a Neurosurgeon for [employer], PA-C Corky Mull, LPN  History of Present Illness:   Chief Complaint  Patient presents with  . Cough    x 4 days  . Sore Throat    x 2 days  . Fever    x 1 day    Cough  This is a new problem. Episode onset: started 4 days ago. The problem has been gradually worsening. The cough is non-productive. Associated symptoms include chills, ear congestion, a fever, headaches, nasal congestion and a sore throat. Pertinent negatives include no ear pain, postnasal drip, shortness of breath or wheezing. Associated symptoms comments: Fatigue. The symptoms are aggravated by lying down. Risk factors for lung disease include occupational exposure. Treatments tried: Mucinex DM and Tylenol. The treatment provided no relief. His past medical history is significant for bronchitis and environmental allergies.  Sore Throat   This is a new problem. Episode onset: started 2 days ao. The problem has been waxing and waning. Neither side of throat is experiencing more pain than the other. Maximum temperature: 100.8-101. The pain is at a severity of 4/10. The pain is moderate. Associated symptoms include coughing, headaches, a plugged ear sensation, neck pain and trouble swallowing. Pertinent negatives include no abdominal pain, congestion, diarrhea, ear pain, shortness of breath, swollen glands or vomiting. Associated symptoms comments: Nausea. He has tried acetaminophen for the symptoms. The treatment provided mild relief.   He does have a positive hx of strep.  Past Medical History:  Diagnosis Date  . Allergy    seasonal allergies  . Moderate persistent asthma 02/15/2015     Social History   Social History  . Marital status: Married    Spouse name: N/A  . Number of children: N/A  . Years of education: N/A   Occupational History  . Not on file.   Social History Main Topics  . Smoking status: Never  Smoker  . Smokeless tobacco: Never Used  . Alcohol use Yes     Comment: socially  . Drug use: No  . Sexual activity: Yes   Other Topics Concern  . Not on file   Social History Narrative   [facility]    Past Surgical History:  Procedure Laterality Date  . WISDOM TOOTH EXTRACTION  1996    Family History  Problem Relation Age of Onset  . Allergic rhinitis Neg Hx   . Angioedema Neg Hx   . Asthma Neg Hx   . Eczema Neg Hx   . Immunodeficiency Neg Hx   . Urticaria Neg Hx   . Prostate cancer Neg Hx   . Colon cancer Neg Hx     No Known Allergies  Current Medications:   Current Outpatient Prescriptions:  .  ibuprofen (ADVIL,MOTRIN) 200 MG tablet, Take 800 mg by mouth every 6 (six) hours as needed for pain (pain). Reported on 02/15/2015, Disp: , Rfl:  .  loratadine (CLARITIN) 10 MG tablet, Take 10 mg by mouth as needed for allergies., Disp: , Rfl:  .  albuterol (PROAIR HFA) 108 (90 Base) MCG/ACT inhaler, Inhale 2 puffs into the lungs every 4 (four) hours as needed for wheezing or shortness of breath. (Patient not taking: Reported on 09/25/2016), Disp: 1 Inhaler, Rfl: 1 .  amoxicillin (AMOXIL) 875 MG tablet, Take 1 tablet (875 mg total) by mouth 2 (two) times daily., Disp: 20 tablet, Rfl: 0 .  diphenhydrAMINE (BENADRYL ALLERGY) 25  mg capsule, Take 25 mg by mouth every 6 (six) hours as needed., Disp: , Rfl:  .  Probiotic Product (PROBIOTIC DAILY PO), Take 1 tablet by mouth daily., Disp: , Rfl:    Review of Systems:   Review of Systems  Constitutional: Positive for chills and fever.  HENT: Positive for sore throat and trouble swallowing. Negative for congestion, ear pain and postnasal drip.   Respiratory: Positive for cough. Negative for shortness of breath and wheezing.   Gastrointestinal: Negative for abdominal pain, diarrhea and vomiting.  Musculoskeletal: Positive for neck pain.  Neurological: Positive for headaches.  Endo/Heme/Allergies: Positive for environmental allergies.     Vitals:   Vitals:   09/25/16 1410  BP: 120/88  Pulse: 93  Temp: 98.8 F (37.1 C)  TempSrc: Oral  SpO2: 96%  Weight: 282 lb (127.9 kg)  Height: 5' 11.25" (1.81 m)     Body mass index is 39.06 kg/m.  Physical Exam:   Physical Exam  Constitutional: He appears well-developed. He is cooperative.  Non-toxic appearance. He does not have a sickly appearance. He does not appear ill. No distress.  HENT:  Head: Normocephalic and atraumatic.  Right Ear: Tympanic membrane, external ear and ear canal normal. Tympanic membrane is not erythematous, not retracted and not bulging.  Left Ear: Tympanic membrane, external ear and ear canal normal. Tympanic membrane is not erythematous, not retracted and not bulging.  Nose: Mucosal edema and rhinorrhea present. No nose lacerations or sinus tenderness. Right sinus exhibits maxillary sinus tenderness. Right sinus exhibits no frontal sinus tenderness. Left sinus exhibits maxillary sinus tenderness. Left sinus exhibits no frontal sinus tenderness.  Mouth/Throat: Uvula is midline and mucous membranes are normal. Posterior oropharyngeal erythema present. No posterior oropharyngeal edema. Tonsils are 1+ on the right. Tonsils are 1+ on the left. No tonsillar exudate.  Eyes: Conjunctivae and lids are normal.  Neck: Trachea normal.  Cardiovascular: Normal rate, regular rhythm, S1 normal, S2 normal, normal heart sounds and normal pulses.   No LE edema  Pulmonary/Chest: Effort normal and breath sounds normal. He has no decreased breath sounds. He has no wheezes. He has no rhonchi. He has no rales.  Lymphadenopathy:    He has no cervical adenopathy.  Neurological: He is alert. GCS eye subscore is 4. GCS verbal subscore is 5. GCS motor subscore is 6.  Skin: Skin is warm, dry and intact.  Psychiatric: He has a normal mood and affect. His speech is normal and behavior is normal.  Nursing note and vitals reviewed.  Results for orders placed or performed in  visit on 09/25/16  POCT rapid strep A  Result Value Ref Range   Rapid Strep A Screen Positive (A) Negative    Assessment and Plan:    Andrew Freeman was seen today for cough, sore throat and fever.  Diagnoses and all orders for this visit:  Strep pharyngitis Rapid strep test positive. Treat with antibiotics per orders. Discussed symptomatic care. Follow-up if symptoms worsen or persist despite treatment, especially if fever or SOB. Provided list of red flags. -     POCT rapid strep A  Other orders -     amoxicillin (AMOXIL) 875 MG tablet; Take 1 tablet (875 mg total) by mouth 2 (two) times daily.   . Reviewed expectations re: course of current medical issues. . Discussed self-management of symptoms. . Outlined signs and symptoms indicating need for more acute intervention. . Patient verbalized understanding and all questions were answered. . See orders for this visit as  documented in the electronic medical record. . Patient received an After-Visit Summary.  CMA or LPN served as scribe during this visit. History, Physical, and Plan performed by medical provider. Documentation and orders reviewed and attested to.  Inda Coke, PA-C

## 2016-12-07 ENCOUNTER — Ambulatory Visit (INDEPENDENT_AMBULATORY_CARE_PROVIDER_SITE_OTHER): Payer: BLUE CROSS/BLUE SHIELD | Admitting: Family Medicine

## 2016-12-07 ENCOUNTER — Encounter: Payer: Self-pay | Admitting: Family Medicine

## 2016-12-07 VITALS — BP 128/88 | HR 84 | Temp 97.8°F | Ht 71.25 in | Wt 287.4 lb

## 2016-12-07 DIAGNOSIS — J111 Influenza due to unidentified influenza virus with other respiratory manifestations: Secondary | ICD-10-CM | POA: Diagnosis not present

## 2016-12-07 DIAGNOSIS — R52 Pain, unspecified: Secondary | ICD-10-CM

## 2016-12-07 LAB — POCT INFLUENZA A/B
INFLUENZA A, POC: NEGATIVE
Influenza B, POC: NEGATIVE

## 2016-12-07 MED ORDER — OSELTAMIVIR PHOSPHATE 75 MG PO CAPS: 75.0000 mg | ORAL_CAPSULE | Freq: Two times a day (BID) | ORAL | 0 refills | Status: DC

## 2016-12-07 NOTE — Patient Instructions (Addendum)
Tamiflu for 5 days- take twice a day with food- get first dose in ASAP  Flu-like illness: strongly suspect influenza History and exam today are suggestive of viral process. Patients influenza test was negative.  Pretest probability of influenza was high. 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 Symptomatic treatment with: rest, hydration  Finally, we reviewed reasons to return to care including if symptoms worsen or persist or new concerns arise particularly worsening symptoms or new symptoms such as shortness of breath. He is contagious at least for 24 hours past elevated temperature (we will ask him to stay out of work until under 99.5 for over 24 hours) but honestly probably contagious for at least a week from when this started.   Meds ordered this encounter  Medications  . oseltamivir (TAMIFLU) 75 MG capsule    Sig: Take 1 capsule (75 mg total) by mouth 2 (two) times daily.    Dispense:  10 capsule    Refill:  0

## 2016-12-07 NOTE — Progress Notes (Signed)
PCP: Jarold MottoWorley, Samantha, PA  Subjective:  Andrew SandiferJames E Freeman is a 36 y.o. year old very pleasant male patient who presents with flu like symptoms including body aches, sore throat, headache, elevated temperature to 100.2.   -started: yesterday morning and has been rapidly worsening- elevated temperature started today -inside 48 hour treatment window if needed for tamiflu: yes -high risk condition (children <5, adults >65, chronic pulmonary or cardiac condition, immunosuppression, pregnancy, nursing home resident, morbid obesity) : no -symptoms are worsening -previous treatments: acetaminophen - patient did NOT receive flu shot this year.  - POSITIVE sick contact; specifically influenza: yes. 3 days ago- 2 kids diagnosed flu positive on test. 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 Moderate persistent asthma 02/15/2015  . Allergic rhinitis 02/15/2015    Medications- reviewed  Current Outpatient Medications  Medication Sig Dispense Refill  . albuterol (PROAIR HFA) 108 (90 Base) MCG/ACT inhaler Inhale 2 puffs into the lungs every 4 (four) hours as needed for wheezing or shortness of breath. 1 Inhaler 1  . diphenhydrAMINE (BENADRYL ALLERGY) 25 mg capsule Take 25 mg by mouth every 6 (six) hours as needed.    Marland Kitchen. ibuprofen (ADVIL,MOTRIN) 200 MG tablet Take 800 mg by mouth every 6 (six) hours as needed for pain (pain). Reported on 02/15/2015    . loratadine (CLARITIN) 10 MG tablet Take 10 mg by mouth as needed for allergies.    . Probiotic Product (PROBIOTIC DAILY PO) Take 1 tablet by mouth daily.     Objective: BP 128/88 (BP Location: Left Arm, Patient Position: Sitting, Cuff Size: Large)   Pulse 84   Temp 97.8 F (36.6 C) (Oral)   Ht 5' 11.25" (1.81 m)   Wt 287 lb 6.4 oz (130.4 kg)   SpO2 97%   BMI 39.80 kg/m  Gen: NAD, appears fatigued HEENT: Turbinates erythematous, TM normal,  pharynx mildly erythematous with no tonsilar exudate or edema, no sinus tenderness CV: RRR no murmurs rubs or gallops Lungs: CTAB no crackles, wheeze, rhonchi Abdomen: soft/nontender/nondistended/normal bowel sounds. Ext: no edema Skin: warm, dry, no rash  Results for orders placed or performed in visit on 12/07/16 (from the past 24 hour(s))  POCT Influenza A/B     Status: None   Collection Time: 12/07/16  4:31 PM  Result Value Ref Range   Influenza A, POC Negative Negative   Influenza B, POC Negative Negative    Assessment/Plan:  Flu-like illness: strongly suspect influenza History and exam today are suggestive of viral process. Patients influenza test was negative.  Pretest probability of influenza was high. 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 Symptomatic treatment with: rest, hydration  Finally, we reviewed reasons to return to care including if symptoms worsen or persist or new concerns arise particularly worsening symptoms or new symptoms such as shortness of breath. He is contagious at least for 24 hours past elevated temperature (we will ask him to stay out of work until under 99.5 for over 24 hours) but honestly probably contagious for at least a week from when this started.   Meds ordered this encounter  Medications  . oseltamivir (TAMIFLU) 75 MG capsule    Sig: Take 1 capsule (75 mg total) by mouth 2 (two) times daily.    Dispense:  10 capsule    Refill:  0    Tana ConchStephen Satoria Dunlop, MD

## 2017-07-23 ENCOUNTER — Encounter: Payer: Self-pay | Admitting: *Deleted

## 2017-09-17 ENCOUNTER — Encounter: Payer: Self-pay | Admitting: Physician Assistant

## 2017-09-17 ENCOUNTER — Ambulatory Visit (INDEPENDENT_AMBULATORY_CARE_PROVIDER_SITE_OTHER): Payer: 59 | Admitting: Physician Assistant

## 2017-09-17 VITALS — BP 126/78 | HR 88 | Temp 98.4°F | Wt 294.0 lb

## 2017-09-17 DIAGNOSIS — J029 Acute pharyngitis, unspecified: Secondary | ICD-10-CM

## 2017-09-17 LAB — POCT RAPID STREP A (OFFICE): RAPID STREP A SCREEN: POSITIVE — AB

## 2017-09-17 MED ORDER — AMOXICILLIN-POT CLAVULANATE 875-125 MG PO TABS: 1.0000 | ORAL_TABLET | Freq: Two times a day (BID) | ORAL | 0 refills | Status: DC

## 2017-09-17 NOTE — Progress Notes (Signed)
Andrew Freeman is a 37 y.o. male here for a new problem.  History of Present Illness:   Chief Complaint  Patient presents with  . Sore Throat    Sore Throat   This is a new problem. The current episode started in the past 7 days. The problem has been gradually worsening. Neither side of throat is experiencing more pain than the other. There has been no fever. The pain is mild. Associated symptoms include congestion, coughing, headaches and swollen glands. Pertinent negatives include no abdominal pain, ear discharge, ear pain, shortness of breath or trouble swallowing. He has tried acetaminophen and NSAIDs for the symptoms. The treatment provided mild relief.    Patient also endorses sinus pressure over the past few days.   Past Medical History:  Diagnosis Date  . Allergy    seasonal allergies  . Moderate persistent asthma 02/15/2015     Social History   Socioeconomic History  . Marital status: Married    Spouse name: Not on file  . Number of children: Not on file  . Years of education: Not on file  . Highest education level: Not on file  Occupational History  . Not on file  Social Needs  . Financial resource strain: Not on file  . Food insecurity:    Worry: Not on file    Inability: Not on file  . Transportation needs:    Medical: Not on file    Non-medical: Not on file  Tobacco Use  . Smoking status: Never Smoker  . Smokeless tobacco: Never Used  Substance and Sexual Activity  . Alcohol use: Yes    Comment: socially  . Drug use: No  . Sexual activity: Yes  Lifestyle  . Physical activity:    Days per week: Not on file    Minutes per session: Not on file  . Stress: Not on file  Relationships  . Social connections:    Talks on phone: Not on file    Gets together: Not on file    Attends religious service: Not on file    Active member of club or organization: Not on file    Attends meetings of clubs or organizations: Not on file    Relationship status: Not on  file  . Intimate partner violence:    Fear of current or ex partner: Not on file    Emotionally abused: Not on file    Physically abused: Not on file    Forced sexual activity: Not on file  Other Topics Concern  . Not on file  Social History Narrative   Works at [employer]    Past Surgical History:  Procedure Laterality Date  . WISDOM TOOTH EXTRACTION  1996    Family History  Problem Relation Age of Onset  . Allergic rhinitis Neg Hx   . Angioedema Neg Hx   . Asthma Neg Hx   . Eczema Neg Hx   . Immunodeficiency Neg Hx   . Urticaria Neg Hx   . Prostate cancer Neg Hx   . Colon cancer Neg Hx     No Known Allergies  Current Medications:   Current Outpatient Medications:  .  albuterol (PROAIR HFA) 108 (90 Base) MCG/ACT inhaler, Inhale 2 puffs into the lungs every 4 (four) hours as needed for wheezing or shortness of breath., Disp: 1 Inhaler, Rfl: 1 .  diphenhydrAMINE (BENADRYL ALLERGY) 25 mg capsule, Take 25 mg by mouth every 6 (six) hours as needed., Disp: , Rfl:  .  ibuprofen (ADVIL,MOTRIN) 200 MG tablet, Take 800 mg by mouth every 6 (six) hours as needed for pain (pain). Reported on 02/15/2015, Disp: , Rfl:  .  loratadine (CLARITIN) 10 MG tablet, Take 10 mg by mouth as needed for allergies., Disp: , Rfl:  .  Probiotic Product (PROBIOTIC DAILY PO), Take 1 tablet by mouth daily., Disp: , Rfl:  .  amoxicillin-clavulanate (AUGMENTIN) 875-125 MG tablet, Take 1 tablet by mouth 2 (two) times daily., Disp: 20 tablet, Rfl: 0   Review of Systems:   Review of Systems  HENT: Positive for congestion. Negative for ear discharge, ear pain and trouble swallowing.   Respiratory: Positive for cough. Negative for shortness of breath.   Gastrointestinal: Negative for abdominal pain.  Neurological: Positive for headaches.    Vitals:   Vitals:   09/17/17 1255  BP: 126/78  Pulse: 88  Temp: 98.4 F (36.9 C)  TempSrc: Oral  SpO2: 98%  Weight: 294 lb (133.4 kg)     Body mass index  is 40.72 kg/m.  Physical Exam:   Physical Exam  Constitutional: He appears well-developed. He is cooperative.  Non-toxic appearance. He does not have a sickly appearance. He does not appear ill. No distress.  HENT:  Head: Normocephalic and atraumatic.  Right Ear: Tympanic membrane, external ear and ear canal normal. Tympanic membrane is not erythematous, not retracted and not bulging.  Left Ear: Tympanic membrane, external ear and ear canal normal. Tympanic membrane is not erythematous, not retracted and not bulging.  Nose: Mucosal edema and rhinorrhea present. Right sinus exhibits maxillary sinus tenderness. Right sinus exhibits no frontal sinus tenderness. Left sinus exhibits maxillary sinus tenderness. Left sinus exhibits no frontal sinus tenderness.  Mouth/Throat: Uvula is midline and mucous membranes are normal. Posterior oropharyngeal erythema present. No posterior oropharyngeal edema. Tonsils are 1+ on the right. Tonsils are 1+ on the left. No tonsillar exudate.  Eyes: Conjunctivae and lids are normal.  Neck: Trachea normal.  Cardiovascular: Normal rate, regular rhythm, S1 normal, S2 normal, normal heart sounds and normal pulses.  No LE edema  Pulmonary/Chest: Effort normal and breath sounds normal. He has no decreased breath sounds. He has no wheezes. He has no rhonchi. He has no rales.  Lymphadenopathy:    He has no cervical adenopathy.  Neurological: He is alert. GCS eye subscore is 4. GCS verbal subscore is 5. GCS motor subscore is 6.  Skin: Skin is warm, dry and intact.  Psychiatric: He has a normal mood and affect. His speech is normal and behavior is normal.  Nursing note and vitals reviewed.  Results for orders placed or performed in visit on 09/17/17  POCT rapid strep A  Result Value Ref Range   Rapid Strep A Screen Positive (A) Negative     Assessment and Plan:    [name] was seen today for sore throat.  Diagnoses and all orders for this visit:  Sore throat -      POCT rapid strep A  Other orders -     amoxicillin-clavulanate (AUGMENTIN) 875-125 MG tablet; Take 1 tablet by mouth 2 (two) times daily.   No red flags on exam.  Step test positive. Concern for early maxillary sinusitis. Will initiate Augmentin per orders. Discussed taking medications as prescribed. Reviewed return precautions including worsening fever, SOB, worsening cough or other concerns. Push fluids and rest. I recommend that patient follow-up if symptoms worsen or persist despite treatment x 7-10 days, sooner if needed.   . Reviewed expectations re: course of  current medical issues. . Discussed self-management of symptoms. . Outlined signs and symptoms indicating need for more acute intervention. . Patient verbalized understanding and all questions were answered. . See orders for this visit as documented in the electronic medical record. . Patient received an After-Visit Summary.   Jarold Motto, PA-C

## 2017-09-17 NOTE — Patient Instructions (Signed)
It was great to see you!  You have strep throat  Use medication as prescribed: Augmentin  Push fluids and get plenty of rest. Please return if you are not improving as expected, or if you have high fevers (>101.5) or difficulty swallowing or worsening productive cough.  Call clinic with questions.  I hope you start feeling better soon!

## 2018-08-10 ENCOUNTER — Emergency Department (HOSPITAL_COMMUNITY)
Admission: EM | Admit: 2018-08-10 | Discharge: 2018-08-11 | Disposition: A | Discharge status: Home / Self Care | Payer: Managed Care, Other (non HMO) | Attending: Emergency Medicine | Admitting: Emergency Medicine

## 2018-08-10 ENCOUNTER — Emergency Department (HOSPITAL_COMMUNITY): Payer: Managed Care, Other (non HMO)

## 2018-08-10 ENCOUNTER — Other Ambulatory Visit: Payer: Self-pay

## 2018-08-10 ENCOUNTER — Encounter (HOSPITAL_COMMUNITY): Payer: Self-pay | Admitting: Emergency Medicine

## 2018-08-10 DIAGNOSIS — R0602 Shortness of breath: Secondary | ICD-10-CM | POA: Diagnosis present

## 2018-08-10 DIAGNOSIS — U071 COVID-19: Secondary | ICD-10-CM

## 2018-08-10 LAB — COMPREHENSIVE METABOLIC PANEL
ALT: 39 U/L (ref 0–44)
AST: 21 U/L (ref 15–41)
Albumin: 4 g/dL (ref 3.5–5.0)
Alkaline Phosphatase: 63 U/L (ref 38–126)
Anion gap: 12 (ref 5–15)
BUN: 14 mg/dL (ref 6–20)
CO2: 23 mmol/L (ref 22–32)
Calcium: 9.1 mg/dL (ref 8.9–10.3)
Chloride: 103 mmol/L (ref 98–111)
Creatinine, Ser: 1.14 mg/dL (ref 0.61–1.24)
GFR calc Af Amer: >60 mL/min (ref >60)
GFR calc non Af Amer: >60 mL/min (ref >60)
Glucose, Bld: 128 mg/dL — ABNORMAL HIGH (ref 70–99)
Potassium: 3.7 mmol/L (ref 3.5–5.1)
Sodium: 138 mmol/L (ref 135–145)
Total Bilirubin: 0.5 mg/dL (ref 0.3–1.2)
Total Protein: 7.7 g/dL (ref 6.5–8.1)

## 2018-08-10 LAB — CBC WITH DIFFERENTIAL/PLATELET
Abs Immature Granulocytes: 0.02 10*3/uL (ref 0.00–0.07)
Basophils Absolute: 0 10*3/uL (ref 0.0–0.1)
Basophils Relative: 1 %
Eosinophils Absolute: 0.1 10*3/uL (ref 0.0–0.5)
Eosinophils Relative: 2 %
HCT: 45.7 % (ref 39.0–52.0)
Hemoglobin: 15.5 g/dL (ref 13.0–17.0)
Immature Granulocytes: 0 %
Lymphocytes Relative: 30 %
Lymphs Abs: 2.4 10*3/uL (ref 0.7–4.0)
MCH: 29.5 pg (ref 26.0–34.0)
MCHC: 33.9 g/dL (ref 30.0–36.0)
MCV: 86.9 fL (ref 80.0–100.0)
Monocytes Absolute: 0.6 10*3/uL (ref 0.1–1.0)
Monocytes Relative: 7 %
Neutro Abs: 4.9 10*3/uL (ref 1.7–7.7)
Neutrophils Relative %: 60 %
Platelets: 299 10*3/uL (ref 150–400)
RBC: 5.26 MIL/uL (ref 4.22–5.81)
RDW: 13.6 % (ref 11.5–15.5)
WBC: 8.1 10*3/uL (ref 4.0–10.5)
nRBC: 0 % (ref 0.0–0.2)

## 2018-08-10 LAB — SARS CORONAVIRUS 2 BY RT PCR (HOSPITAL ORDER, PERFORMED IN ~~LOC~~ HOSPITAL LAB): SARS Coronavirus 2: POSITIVE — AB

## 2018-08-10 NOTE — ED Provider Notes (Signed)
[facility] Provider Note   CSN: [id number] Arrival date & time: 08/10/18  2037    History   Chief Complaint Chief Complaint  Patient presents with  . Shortness of Breath    Cough/Body aches    HPI Andrew Freeman is a 38 y.o. male.     The history is provided by the patient.  Shortness of Breath Severity:  Mild Onset quality:  Gradual Timing:  Intermittent Progression:  Waxing and waning Context: URI   Relieved by:  Nothing Worsened by:  Nothing Associated symptoms: cough   Associated symptoms: no abdominal pain, no chest pain, no ear pain, no fever, no rash, no sore throat, no vomiting and no wheezing     Past Medical History:  Diagnosis Date  . Allergy    seasonal allergies  . Moderate persistent asthma 02/15/2015    Patient Active Problem List   Diagnosis Date Noted  . Moderate persistent asthma 02/15/2015  . Allergic rhinitis 02/15/2015    Past Surgical History:  Procedure Laterality Date  . WISDOM TOOTH EXTRACTION  1996        Home Medications    Prior to Admission medications   Medication Sig Start Date End Date Taking? Authorizing Provider  albuterol (PROAIR HFA) 108 (90 Base) MCG/ACT inhaler Inhale 2 puffs into the lungs every 4 (four) hours as needed for wheezing or shortness of breath. 02/15/15   Bobbitt, Heywood Ilesalph Carter, MD  amoxicillin-clavulanate (AUGMENTIN) 875-125 MG tablet Take 1 tablet by mouth 2 (two) times daily. 09/17/17   Jarold MottoWorley, Samantha, PA  diphenhydrAMINE (BENADRYL ALLERGY) 25 mg capsule Take 25 mg by mouth every 6 (six) hours as needed.    [provider]  ibuprofen (ADVIL,MOTRIN) 200 MG tablet Take 800 mg by mouth every 6 (six) hours as needed for pain (pain). Reported on 02/15/2015    [provider]  loratadine (CLARITIN) 10 MG tablet Take 10 mg by mouth as needed for allergies.    [provider]  Probiotic Product (PROBIOTIC DAILY PO) Take 1 tablet by mouth daily.     [provider]    Family History Family History  Problem Relation Age of Onset  . Allergic rhinitis Neg Hx   . Angioedema Neg Hx   . Asthma Neg Hx   . Eczema Neg Hx   . Immunodeficiency Neg Hx   . Urticaria Neg Hx   . Prostate cancer Neg Hx   . Colon cancer Neg Hx     Social History Social History   Tobacco Use  . Smoking status: Never Smoker  . Smokeless tobacco: Never Used  Substance Use Topics  . Alcohol use: Yes    Comment: socially  . Drug use: No     Allergies   Patient has no known allergies.   Review of Systems Review of Systems  Constitutional: Negative for chills and fever.  HENT: Negative for ear pain and sore throat.   Eyes: Negative for pain and visual disturbance.  Respiratory: Positive for cough and shortness of breath. Negative for wheezing.   Cardiovascular: Negative for chest pain and palpitations.  Gastrointestinal: Negative for abdominal pain and vomiting.  Genitourinary: Negative for dysuria and hematuria.  Musculoskeletal: Negative for arthralgias and back pain.  Skin: Negative for color change and rash.  Neurological: Negative for seizures and syncope.  All other systems reviewed and are negative.    Physical Exam Updated Vital Signs  ED Triage Vitals  Enc Vitals Group  BP 08/10/18 2047 (!) 136/102     Pulse Rate 08/10/18 2047 84     Resp 08/10/18 2047 18     Temp 08/10/18 2047 97.9 F (36.6 C)     Temp Source 08/10/18 2047 Oral     SpO2 08/10/18 2047 100 %     Weight 08/10/18 2048 290 lb (131.5 kg)     Height 08/10/18 2048 5\' 11"  (1.803 m)     Head Circumference --      Peak Flow --      Pain Score 08/10/18 2055 0     Pain Loc --      Pain Edu? --      Excl. in [place]? --     Physical Exam Vitals signs and nursing note reviewed.  Constitutional:      General: He is not in acute distress.    Appearance: He is well-developed. He is not ill-appearing.  HENT:     Head: Normocephalic and atraumatic.      Mouth/Throat:     Mouth: Mucous membranes are moist.  Eyes:     Conjunctiva/sclera: Conjunctivae normal.     Pupils: Pupils are equal, round, and reactive to light.  Neck:     Musculoskeletal: Normal range of motion and neck supple.  Cardiovascular:     Rate and Rhythm: Normal rate and regular rhythm.     Heart sounds: No murmur.  Pulmonary:     Effort: Pulmonary effort is normal. No respiratory distress.     Breath sounds: Normal breath sounds. No decreased breath sounds, wheezing, rhonchi or rales.  Abdominal:     Palpations: Abdomen is soft.     Tenderness: There is no abdominal tenderness.  Musculoskeletal: Normal range of motion.     Right lower leg: No edema.     Left lower leg: No edema.  Skin:    General: Skin is warm and dry.     Capillary Refill: Capillary refill takes less than 2 seconds.  Neurological:     General: No focal deficit present.     Mental Status: He is alert.  Psychiatric:        Mood and Affect: Mood normal.      ED Treatments / Results  Labs (all labs ordered are listed, but only abnormal results are displayed) Labs Reviewed  SARS CORONAVIRUS 2 (HOSPITAL ORDER, [facility]) - Abnormal; Notable for the following components:      Result Value   SARS Coronavirus 2 POSITIVE (*)    All other components within normal limits  COMPREHENSIVE METABOLIC PANEL - Abnormal; Notable for the following components:   Glucose, Bld 128 (*)    All other components within normal limits  CBC WITH DIFFERENTIAL/PLATELET    EKG EKG Interpretation  Date/Time:  Saturday August 10 2018 20:46:34 EDT Ventricular Rate:  90 PR Interval:  140 QRS Duration: 78 QT Interval:  366 QTC Calculation: 447 R Axis:   57 Text Interpretation:  Normal sinus rhythm T wave abnormality, consider inferior ischemia Abnormal ECG Confirmed by Lennice Sites [phone]) on 08/10/2018 10:17:14 PM   Radiology Dg Chest Portable 1 View  Result Date: 08/10/2018 CLINICAL  DATA:  Initial evaluation for acute shortness of breath, cough. EXAM: PORTABLE CHEST 1 VIEW COMPARISON:  Prior radiograph from 08/22/2010. FINDINGS: Cardiac and mediastinal silhouettes are within normal limits. Lungs mildly hypoinflated. No focal infiltrates or consolidative airspace opacity. No pulmonary edema or pleural effusion. No pneumothorax. No acute osseous finding. IMPRESSION: Low  lung volumes with no radiographic evidence for active cardiopulmonary disease. Electronically Signed   By: Benjamin  McClintock M.D.   Rise Mun: 08/10/2018 22:54    Procedures Procedures (including critical care time)  Medications Ordered in ED Medications - No data to display   Initial Impression / Assessment and Plan / ED Course  I have reviewed the triage vital signs and the nursing notes.  Pertinent labs & imaging results that were available during my care of the patient were reviewed by me and considered in my medical decision making (see chart for details).        Guy SandiferJames E Freeman is a 38 year old male with no significant medical history who presents to the ED with body aches, shortness of breath.  Has remote history of asthma.  Does not use inhaler.  Concern for coronavirus.  Works at an urgent care.  Patient with normal vitals.  No fever.  Normal work of breathing.  Normal room air oxygen.  Lab work unremarkable.  Chest x-ray overall with no obvious pneumonia, no pneumothorax, no pleural effusion.  EKG shows sinus rhythm.  Patient positive for coronavirus.  Overall patient given education about self isolation which she has already been doing.  He has assumed that he has been positive as he has loss sense of smell and taste.  Patient was given return precautions and discharged from ED in good condition.  Patient did not have any hypoxia with ambulation.  This chart was dictated using voice recognition software.  Despite best efforts to proofread,  errors can occur which can change the documentation meaning.     Final Clinical Impressions(s) / ED Diagnoses   Final diagnoses:  COVID-19 virus detected    ED Discharge Orders    None       Virgina NorfolkCuratolo, Toye Rouillard, DO 08/11/18 0018

## 2018-08-10 NOTE — ED Triage Notes (Signed)
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

## 2018-08-11 NOTE — ED Notes (Signed)
Discharge instructions discussed with pt. Pt verbalized understanding. Pt stable and ambulatory. No signature pad available. 

## 2019-01-12 DIAGNOSIS — J069 Acute upper respiratory infection, unspecified: Secondary | ICD-10-CM | POA: Diagnosis not present

## 2019-01-12 DIAGNOSIS — Z20822 Contact with and (suspected) exposure to covid-19: Secondary | ICD-10-CM | POA: Diagnosis not present

## 2019-12-22 IMAGING — DX PORTABLE CHEST - 1 VIEW
1 series · 1 of 1 positions shown · non-contrast
Comparison: Prior radiograph from 08/22/2010.

CLINICAL DATA: Initial evaluation for acute shortness of breath,
cough.

EXAM:
PORTABLE CHEST 1 VIEW

[chest]
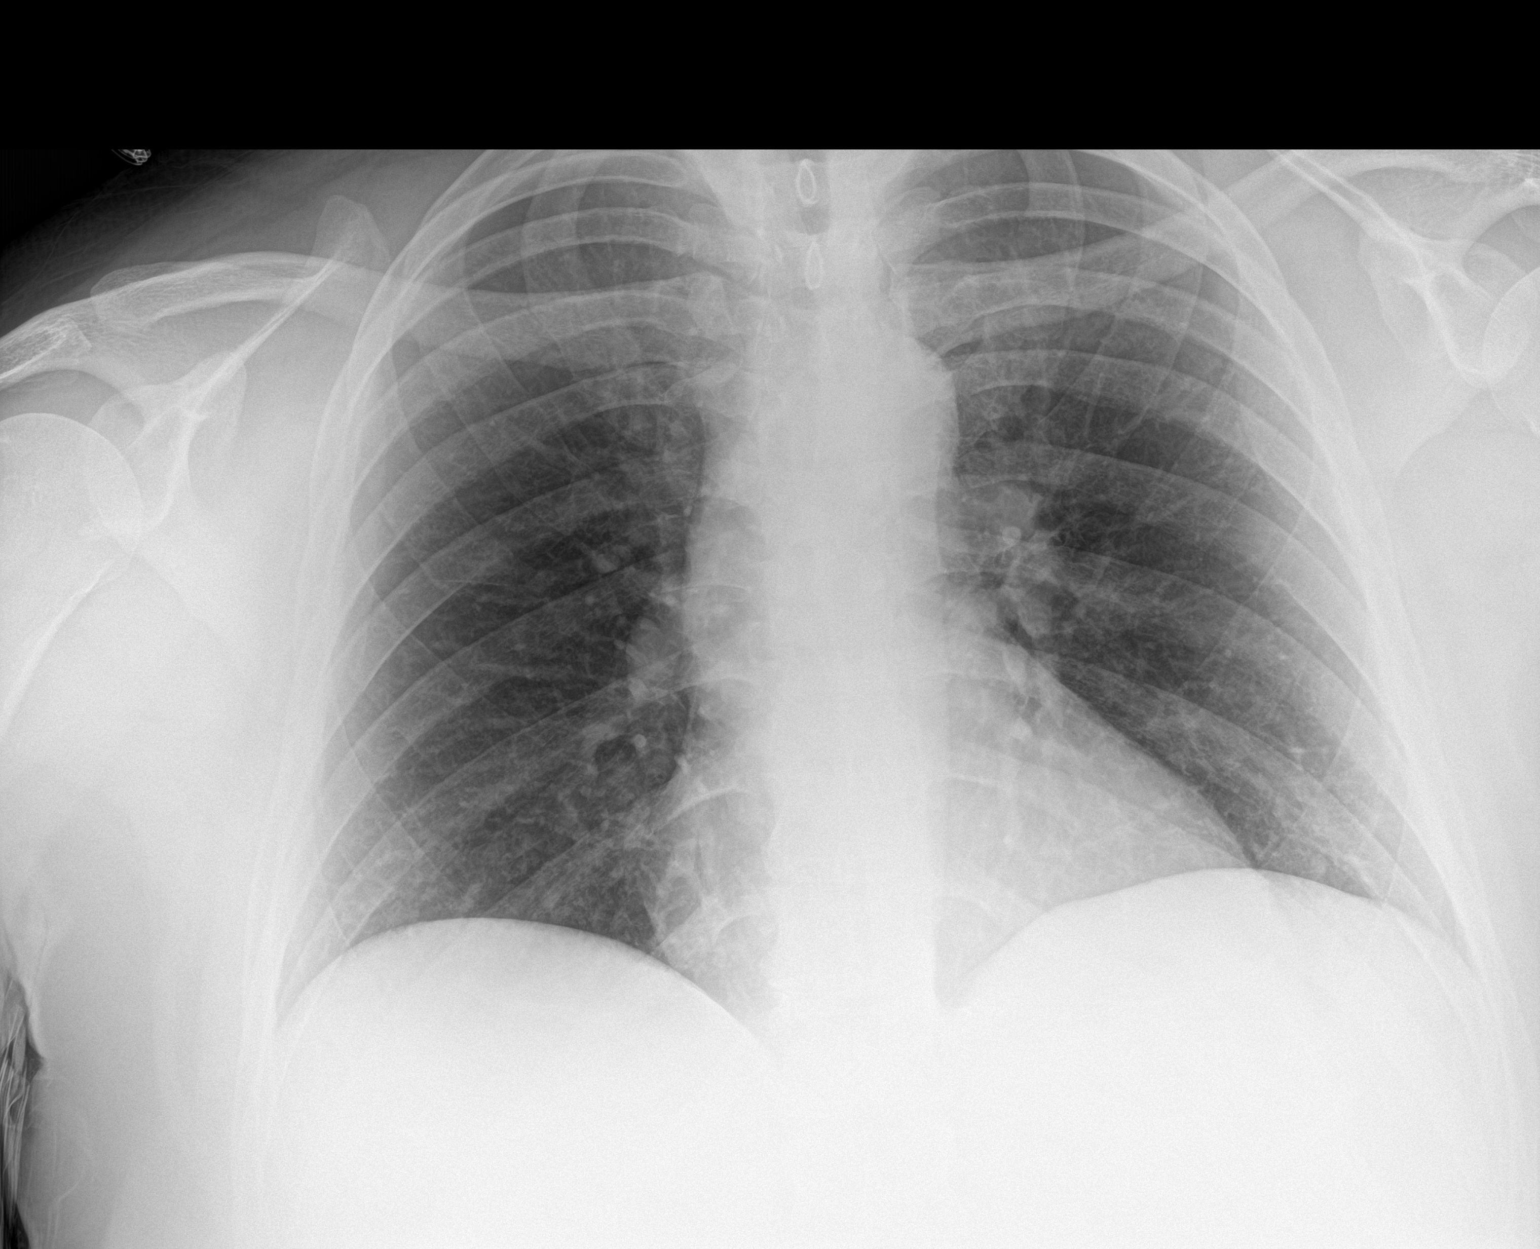

[1 of 1 positions shown; findings below may reference images not displayed]

FINDINGS: Cardiac and mediastinal silhouettes are within normal limits.

Lungs mildly hypoinflated. No focal infiltrates or consolidative
airspace opacity. No pulmonary edema or pleural effusion. No
pneumothorax.

No acute osseous finding.
IMPRESSION: Low lung volumes with no radiographic evidence for active
cardiopulmonary disease.

## 2019-12-29 ENCOUNTER — Encounter: Payer: Self-pay | Admitting: Physician Assistant

## 2019-12-29 ENCOUNTER — Other Ambulatory Visit: Payer: Self-pay

## 2019-12-29 ENCOUNTER — Ambulatory Visit (INDEPENDENT_AMBULATORY_CARE_PROVIDER_SITE_OTHER): Payer: BC Managed Care – PPO | Admitting: Physician Assistant

## 2019-12-29 VITALS — BP 130/90 | HR 75 | Temp 98.1°F | Ht 71.0 in | Wt 295.0 lb

## 2019-12-29 DIAGNOSIS — Z1159 Encounter for screening for other viral diseases: Secondary | ICD-10-CM | POA: Diagnosis not present

## 2019-12-29 DIAGNOSIS — R5383 Other fatigue: Secondary | ICD-10-CM

## 2019-12-29 DIAGNOSIS — Z0001 Encounter for general adult medical examination with abnormal findings: Secondary | ICD-10-CM | POA: Diagnosis not present

## 2019-12-29 DIAGNOSIS — Z136 Encounter for screening for cardiovascular disorders: Secondary | ICD-10-CM

## 2019-12-29 DIAGNOSIS — K580 Irritable bowel syndrome with diarrhea: Secondary | ICD-10-CM

## 2019-12-29 DIAGNOSIS — Z13220 Encounter for screening for lipoid disorders: Secondary | ICD-10-CM

## 2019-12-29 DIAGNOSIS — Z23 Encounter for immunization: Secondary | ICD-10-CM | POA: Diagnosis not present

## 2019-12-29 DIAGNOSIS — R079 Chest pain, unspecified: Secondary | ICD-10-CM

## 2019-12-29 DIAGNOSIS — E669 Obesity, unspecified: Secondary | ICD-10-CM

## 2019-12-29 DIAGNOSIS — G4739 Other sleep apnea: Secondary | ICD-10-CM

## 2019-12-29 DIAGNOSIS — E559 Vitamin D deficiency, unspecified: Secondary | ICD-10-CM

## 2019-12-29 NOTE — Progress Notes (Signed)
I acted as a Neurosurgeon for [employer], PA-C [employer], LPN   Subjective:    Andrew Freeman is a 39 y.o. male and is here for a comprehensive physical exam.  HPI  Health Maintenance Due  Topic Date Due   Hepatitis C Screening  Never done   COVID-19 Vaccine (1) Never done    Acute Concerns: Fatigue -- works 8-12 hour shifts for 4 days per week. He sleeps from 8:30p - 5:30a. No trouble falling/staying asleep but does have issues with sleep apnea (per wife -- he snores and intermittently stops breathing.) No concerns with anxiety and depression. Eats fast food and biscuits regularly. Drinks: 1 Pharmacologist drink a day and water. No excessive alcohol intake. Denies rectal bleeding. No exercise.  Abdominal pain/frequent bowel movements -- seen by [facility] in 2017. Was diagnosed with likely IBS-D. Was recommended to have colonoscopy and stool studies but he did not do this. He is ready to pursue this work-up now. Having BM about 10 times a day and significant food sensitivities. No rectal bleeding. SOB/Chest pain -- patient reports SOB with regular activities, has worsened since COVID in October. Gets SOB with stairs and other regular activities. Admits to chest pain with some of these episodes as well. Denies: dizziness, lightheadedness, nausea. Does have history of asthma and has not used his albuterol inhaler for these episodes, does not feel like its his asthma.  Chronic Issues: None  Health Maintenance: Immunizations -- UTD Colonoscopy -- N/A PSA -- N/A Diet -- fast food during day, healthy meals at night Caffeine intake -- monster drink daily Sleep habits -- sleeps from 830p-530a with some concerns for sleep apnea Exercise -- none Weight -- Weight: 295 lb (133.8 kg)  Weight history Wt Readings from Last 10 Encounters:  12/29/19 295 lb (133.8 kg)  08/10/18 290 lb (131.5 kg)  09/17/17 294 lb (133.4 kg)  12/07/16 287 lb 6.4 oz (130.4 kg)  09/25/16 282 lb (127.9  kg)  07/04/16 283 lb 6.1 oz (128.5 kg)  08/02/12 265 lb (120.2 kg)   Body mass index is 41.14 kg/m. Mood -- no concerns Tobacco use --  Tobacco Use: Low Risk    Smoking Tobacco Use: Never Smoker   Smokeless Tobacco Use: Never Used    Alcohol use ---  reports current alcohol use.   Depression screen PHQ 2/9 12/29/2019  Decreased Interest 0  Down, Depressed, Hopeless 0  PHQ - 2 Score 0   Not UTD with dentist and eye doctor  Other providers/specialists: Patient Care Team: Jarold Motto, [place] as PCP - General (Physician Assistant)   PMHx, SurgHx, SocialHx, Medications, and Allergies were reviewed in the Visit Navigator and updated as appropriate.   Past Medical History:  Diagnosis Date   Allergy    seasonal allergies   Moderate persistent asthma 02/15/2015     Past Surgical History:  Procedure Laterality Date   WISDOM TOOTH EXTRACTION  1996     Family History  Problem Relation Age of Onset   Other Mother        significant GI history   Allergic rhinitis Neg Hx    Angioedema Neg Hx    Asthma Neg Hx    Eczema Neg Hx    Immunodeficiency Neg Hx    Urticaria Neg Hx    Prostate cancer Neg Hx    Colon cancer Neg Hx     Social History   Tobacco Use   Smoking status: Never Smoker   Smokeless  tobacco: Never Used  Vaping Use   Vaping Use: Never used  Substance Use Topics   Alcohol use: Yes    Comment: socially   Drug use: No    Review of Systems:   Review of Systems  Constitutional: Negative.  Negative for chills, fever, malaise/fatigue and weight loss.  HENT: Negative.  Negative for hearing loss, sinus pain and sore throat.   Eyes: Negative.  Negative for blurred vision.  Respiratory: Positive for shortness of breath. Negative for cough.   Cardiovascular: Positive for chest pain. Negative for palpitations and leg swelling.  Gastrointestinal: Positive for constipation and diarrhea. Negative for abdominal pain, heartburn, nausea and  vomiting.  Genitourinary: Negative.  Negative for dysuria, frequency and urgency.  Musculoskeletal: Negative.  Negative for back pain, myalgias and neck pain.  Skin: Negative.  Negative for itching and rash.  Neurological: Negative.  Negative for dizziness, tingling, seizures, loss of consciousness and headaches.  Endo/Heme/Allergies: Negative.  Negative for polydipsia.  Psychiatric/Behavioral: Negative.  Negative for depression. The patient is not nervous/anxious.     Objective:   Vitals:   12/29/19 0800  BP: 130/90  Pulse: 75  Temp: 98.1 F (36.7 C)  SpO2: 96%   Body mass index is 41.14 kg/m.  General Appearance:  Alert, cooperative, no distress, appears stated age  Head:  Normocephalic, without obvious abnormality, atraumatic  Eyes:  PERRL, conjunctiva/corneas clear, EOM's intact, fundi benign, both eyes       Ears:  Normal TM's and external ear canals, both ears  Nose: Nares normal, septum midline, mucosa normal, no drainage    or sinus tenderness  Throat: Lips, mucosa, and tongue normal; teeth and gums normal  Neck: Supple, symmetrical, trachea midline, no adenopathy; thyroid:  No enlargement/tenderness/nodules; no carotit bruit or JVD  Back:   Symmetric, no curvature, ROM normal, no CVA tenderness  Lungs:   Clear to auscultation bilaterally, respirations unlabored  Chest wall:  No tenderness or deformity  Heart:  Regular rate and rhythm, S1 and S2 normal, no murmur, rub   or gallop  Abdomen:   Soft, non-tender, bowel sounds active all four quadrants, no masses, no organomegaly  Extremities: Extremities normal, atraumatic, no cyanosis or edema  Prostate: Not done.   Skin: Skin color, texture, turgor normal, no rashes or lesions  Lymph nodes: Cervical, supraclavicular, and axillary nodes normal  Neurologic: CNII-XII grossly intact. Normal strength, sensation and reflexes throughout    Assessment/Plan:   Edge was seen today for annual exam.  Diagnoses and all orders  for this visit:  Encounter for general adult medical examination with abnormal findings Today patient counseled on age appropriate routine health concerns for screening and prevention, each reviewed and up to date or declined. Immunizations reviewed and up to date or declined. Labs ordered and reviewed. Risk factors for depression reviewed and negative. Hearing function and visual acuity are intact. ADLs screened and addressed as needed. Functional ability and level of safety reviewed and appropriate. Education, counseling and referrals performed based on assessed risks today. Patient provided with a copy of personalized plan for preventive services.  Fatigue, unspecified type Suspect multifactorial: likely undiagnosed sleep apnea, lack of exercise/poor dietary habits, among other causes. Will update blood work today to r/o organic cause of symptoms. Follow-up based on results and clinical symptoms. -     CBC with Differential/Platelet; Future -     Comprehensive metabolic panel; Future -     Vitamin B12; Future -     TSH; Future -  Testosterone; Future -     Testosterone -     TSH -     Vitamin B12 -     Comprehensive metabolic panel -     CBC with Differential/Platelet  Vitamin D deficiency Update Vit D level today and provide recommendations accordingly. -     VITAMIN D 25 Hydroxy (Vit-D Deficiency, Fractures); Future -     VITAMIN D 25 Hydroxy (Vit-D Deficiency, Fractures)  Encounter for lipid screening for cardiovascular disease -     Lipid panel; Future -     Lipid panel  Encounter for screening for other viral diseases -     Hepatitis C antibody; Future -     Hepatitis C antibody  Irritable bowel syndrome with diarrhea Agreeable to referral and appropriate work-up with GI. Referral placed. -     Ambulatory referral to Gastroenterology  Chest pain on exertion EKG tracing is personally reviewed.  EKG notes NSR.  No acute changes.  Referral to cardiology. Worsening  precautions advised in the interim. -     Ambulatory referral to Cardiology -     EKG 12-Lead  Sleep apnea-like behavior Referral for sleep studies. -     Ambulatory referral to Sleep Studies  Obesity, unspecified classification, unspecified obesity type, unspecified whether serious comorbidity present Encouraged healthy diet and exercise as able. Interested in weight loss medications but I'm hesitant given significant uncontrolled GI symptoms.   Well Adult Exam: Labs ordered: Yes. Patient counseling was done. See below for items discussed. Discussed the patient's BMI.  The BMI is not in the acceptable range; BMI management plan is completed Follow up in 3 months.  Patient Counseling: [x]   Nutrition: Stressed importance of moderation in sodium/caffeine intake, saturated fat and cholesterol, caloric balance, sufficient intake of fresh fruits, vegetables, and fiber.  [x]   Stressed the importance of regular exercise.   []   Substance Abuse: Discussed cessation/primary prevention of tobacco, alcohol, or other drug use; driving or other dangerous activities under the influence; availability of treatment for abuse.   [x]   Injury prevention: Discussed safety belts, safety helmets, smoke detector, smoking near bedding or upholstery.   []   Sexuality: Discussed sexually transmitted diseases, partner selection, use of condoms, avoidance of unintended pregnancy  and contraceptive alternatives.   [x]   Dental health: Discussed importance of regular tooth brushing, flossing, and dental visits.  [x]   Health maintenance and immunizations reviewed. Please refer to Health maintenance section.    CMA or LPN served as scribe during this visit. History, Physical, and Plan performed by medical provider. The above documentation has been reviewed and is accurate and complete.  , PA-C [facility]

## 2019-12-29 NOTE — Patient Instructions (Addendum)
It was great to see you!  Referrals for: -Gastroenterology -Cardiology -Sleep medicine  Please go to the lab for blood work.   Our office will call you with your results unless you have chosen to receive results via MyChart.  If your blood work is normal we will follow-up each year for physicals and as scheduled for chronic medical problems.  If anything is abnormal we will treat accordingly and get you in for a follow-up.  Take care,  [facility] Maintenance, Male Adopting a healthy lifestyle and getting preventive care are important in promoting health and wellness. Ask your health care provider about:  The right schedule for you to have regular tests and exams.  Things you can do on your own to prevent diseases and keep yourself healthy. What should I know about diet, weight, and exercise? Eat a healthy diet   Eat a diet that includes plenty of vegetables, fruits, low-fat dairy products, and lean protein.  Do not eat a lot of foods that are high in solid fats, added sugars, or sodium. Maintain a healthy weight Body mass index (BMI) is a measurement that can be used to identify possible weight problems. It estimates body fat based on height and weight. Your health care provider can help determine your BMI and help you achieve or maintain a healthy weight. Get regular exercise Get regular exercise. This is one of the most important things you can do for your health. Most adults should:  Exercise for at least 150 minutes each week. The exercise should increase your heart rate and make you sweat (moderate-intensity exercise).  Do strengthening exercises at least twice a week. This is in addition to the moderate-intensity exercise.  Spend less time sitting. Even light physical activity can be beneficial. Watch cholesterol and blood lipids Have your blood tested for lipids and cholesterol at 37 years of age, then have this test every 5 years. You may need to have  your cholesterol levels checked more often if:  Your lipid or cholesterol levels are high.  You are older than 37 years of age.  You are at high risk for heart disease. What should I know about cancer screening? Many types of cancers can be detected early and may often be prevented. Depending on your health history and family history, you may need to have cancer screening at various ages. This may include screening for:  Colorectal cancer.  Prostate cancer.  Skin cancer.  Lung cancer. What should I know about heart disease, diabetes, and high blood pressure? Blood pressure and heart disease  High blood pressure causes heart disease and increases the risk of stroke. This is more likely to develop in people who have high blood pressure readings, are of African descent, or are overweight.  Talk with your health care provider about your target blood pressure readings.  Have your blood pressure checked: ? Every 3-5 years if you are 47-53 years of age. ? Every year if you are 59 years old or older.  If you are between the ages of 18 and 2 and are a current or former smoker, ask your health care provider if you should have a one-time screening for abdominal aortic aneurysm (AAA). Diabetes Have regular diabetes screenings. This checks your fasting blood sugar level. Have the screening done:  Once every three years after age 53 if you are at a normal weight and have a low risk for diabetes.  More often and at a younger age if you  are overweight or have a high risk for diabetes. What should I know about preventing infection? Hepatitis B If you have a higher risk for hepatitis B, you should be screened for this virus. Talk with your health care provider to find out if you are at risk for hepatitis B infection. Hepatitis C Blood testing is recommended for:  Everyone born from [birth year] through 1963.  Anyone with known risk factors for hepatitis C. Sexually transmitted infections  (STIs)  You should be screened each year for STIs, including gonorrhea and chlamydia, if: ? You are sexually active and are younger than 37 years of age. ? You are older than 37 years of age and your health care provider tells you that you are at risk for this type of infection. ? Your sexual activity has changed since you were last screened, and you are at increased risk for chlamydia or gonorrhea. Ask your health care provider if you are at risk.  Ask your health care provider about whether you are at high risk for HIV. Your health care provider may recommend a prescription medicine to help prevent HIV infection. If you choose to take medicine to prevent HIV, you should first get tested for HIV. You should then be tested every 3 months for as long as you are taking the medicine. Follow these instructions at home: Lifestyle  Do not use any products that contain nicotine or tobacco, such as cigarettes, e-cigarettes, and chewing tobacco. If you need help quitting, ask your health care provider.  Do not use street drugs.  Do not share needles.  Ask your health care provider for help if you need support or information about quitting drugs. Alcohol use  Do not drink alcohol if your health care provider tells you not to drink.  If you drink alcohol: ? Limit how much you have to 0-2 drinks a day. ? Be aware of how much alcohol is in your drink. In the [place] one drink equals one 12 oz bottle of beer (355 mL), one 5 oz glass of wine (148 mL), or one 1 oz glass of hard liquor (44 mL). General instructions  Schedule regular health, dental, and eye exams.  Stay current with your vaccines.  Tell your health care provider if: ? You often feel depressed. ? You have ever been abused or do not feel safe at home. Summary  Adopting a healthy lifestyle and getting preventive care are important in promoting health and wellness.  Follow your health care provider's instructions about healthy diet,  exercising, and getting tested or screened for diseases.  Follow your health care provider's instructions on monitoring your cholesterol and blood pressure. This information is not intended to replace advice given to you by your health care provider. Make sure you discuss any questions you have with your health care provider. Document Revised: 12/19/2017 Document Reviewed: 12/19/2017 Elsevier Patient Education  2020 [employer].

## 2019-12-30 ENCOUNTER — Encounter: Payer: Self-pay | Admitting: Physician Assistant

## 2019-12-30 ENCOUNTER — Other Ambulatory Visit: Payer: Self-pay | Admitting: Physician Assistant

## 2019-12-30 DIAGNOSIS — R7989 Other specified abnormal findings of blood chemistry: Secondary | ICD-10-CM

## 2019-12-30 LAB — CBC WITH DIFFERENTIAL/PLATELET
Absolute Monocytes: 651 cells/uL (ref 200–950)
Basophils Absolute: 52 cells/uL (ref 0–200)
Basophils Relative: 0.7 %
Eosinophils Absolute: 252 cells/uL (ref 15–500)
Eosinophils Relative: 3.4 %
HCT: 43.3 % (ref 38.5–50.0)
Hemoglobin: 14.7 g/dL (ref 13.2–17.1)
Lymphs Abs: 2050 cells/uL (ref 850–3900)
MCH: 29.5 pg (ref 27.0–33.0)
MCHC: 33.9 g/dL (ref 32.0–36.0)
MCV: 86.8 fL (ref 80.0–100.0)
MPV: 8.9 fL (ref 7.5–12.5)
Monocytes Relative: 8.8 %
Neutro Abs: 4396 cells/uL (ref 1500–7800)
Neutrophils Relative %: 59.4 %
Platelets: 326 10*3/uL (ref 140–400)
RBC: 4.99 10*6/uL (ref 4.20–5.80)
RDW: 13.6 % (ref 11.0–15.0)
Total Lymphocyte: 27.7 %
WBC: 7.4 10*3/uL (ref 3.8–10.8)

## 2019-12-30 LAB — LIPID PANEL
Cholesterol: 170 mg/dL (ref <200)
HDL: 41 mg/dL (ref >=40)
LDL Cholesterol (Calc): 112 mg/dL (calc) — ABNORMAL HIGH
Non-HDL Cholesterol (Calc): 129 mg/dL (calc) (ref <130)
Total CHOL/HDL Ratio: 4.1 (calc) (ref <5.0)
Triglycerides: 82 mg/dL (ref <150)

## 2019-12-30 LAB — COMPREHENSIVE METABOLIC PANEL
AG Ratio: 1.4 (calc) (ref 1.0–2.5)
ALT: 26 U/L (ref 9–46)
AST: 17 U/L (ref 10–40)
Albumin: 4.4 g/dL (ref 3.6–5.1)
Alkaline phosphatase (APISO): 64 U/L (ref 36–130)
BUN: 13 mg/dL (ref 7–25)
CO2: 28 mmol/L (ref 20–32)
Calcium: 9.5 mg/dL (ref 8.6–10.3)
Chloride: 104 mmol/L (ref 98–110)
Creat: 1.24 mg/dL (ref 0.60–1.35)
Globulin: 3.1 g/dL (calc) (ref 1.9–3.7)
Glucose, Bld: 83 mg/dL (ref 65–99)
Potassium: 4.7 mmol/L (ref 3.5–5.3)
Sodium: 140 mmol/L (ref 135–146)
Total Bilirubin: 0.5 mg/dL (ref 0.2–1.2)
Total Protein: 7.5 g/dL (ref 6.1–8.1)

## 2019-12-30 LAB — HEPATITIS C ANTIBODY
Hepatitis C Ab: NONREACTIVE
SIGNAL TO CUT-OFF: 0.3 (ref <1.00)

## 2019-12-30 LAB — VITAMIN B12: Vitamin B-12: 357 pg/mL (ref 200–1100)

## 2019-12-30 LAB — TSH: TSH: 3.17 mIU/L (ref 0.40–4.50)

## 2019-12-30 LAB — VITAMIN D 25 HYDROXY (VIT D DEFICIENCY, FRACTURES): Vit D, 25-Hydroxy: 26 ng/mL — ABNORMAL LOW (ref 30–100)

## 2019-12-30 LAB — TESTOSTERONE: Testosterone: 237 ng/dL — ABNORMAL LOW (ref 250–827)

## 2019-12-30 MED ORDER — CYANOCOBALAMIN 1000 MCG/ML IJ SOLN: INTRAMUSCULAR | 1 refills | Status: DC

## 2020-01-11 NOTE — Progress Notes (Signed)
Cardiology Office Note:    Date:  01/13/2020   ID:  Andrew Freeman, DOB 09/16/1980, MRN [id number]  PCP:  Inda Coke, PA  Cardiologist:  No primary care provider on file.  Electrophysiologist:  None   Referring MD: Inda Coke, PA   Chief Complaint  Patient presents with  . Chest Pain    History of Present Illness:    Andrew Freeman is a 40 y.o. male with a hx of asthma who is referred by Inda Coke, PA for evaluation of chest pain.  Reports had COVID-19 infection in October 2020.  States he started having chest pain after Covid infection.  Describes chest pain as pressure in the center of his chest that occurs with exertion.  Climbing up stairs causes chest pain.  Also with shortness of breath.  Chest pain resolves with rest, can last up to a couple of minutes.  He denies any lightheadedness or syncope.  Does note intermittent lower extremity edema.  Denies any palpitations.  No smoking history, rare cigar use.  No history of heart disease in his immediate family    Past Medical History:  Diagnosis Date  . Allergy    seasonal allergies  . Moderate persistent asthma 02/15/2015    Past Surgical History:  Procedure Laterality Date  . WISDOM TOOTH EXTRACTION  1996    Current Medications: Current Meds  Medication Sig  . albuterol (PROAIR HFA) 108 (90 Base) MCG/ACT inhaler Inhale 2 puffs into the lungs every 4 (four) hours as needed for wheezing or shortness of breath.  . cyanocobalamin (,VITAMIN B-12,) 1000 MCG/ML injection 1000 mcg (1 mg) injection once per week for four weeks, followed by 1000 mcg injection once per month.  . diphenhydrAMINE (BENADRYL) 25 mg capsule Take 25 mg by mouth every 6 (six) hours as needed.  Marland Kitchen ibuprofen (ADVIL,MOTRIN) 200 MG tablet Take 800 mg by mouth every 6 (six) hours as needed for pain (pain). Reported on 02/15/2015  . loratadine (CLARITIN) 10 MG tablet Take 10 mg by mouth as needed for allergies.  . metoprolol tartrate (LOPRESSOR)  50 MG tablet Take within two hours of the test     Allergies:   Patient has no known allergies.   Social History   Socioeconomic History  . Marital status: Married    Spouse name: Not on file  . Number of children: Not on file  . Years of education: Not on file  . Highest education level: Not on file  Occupational History  . Not on file  Tobacco Use  . Smoking status: Never Smoker  . Smokeless tobacco: Never Used  Vaping Use  . Vaping Use: Never used  Substance and Sexual Activity  . Alcohol use: Yes    Comment: socially  . Drug use: No  . Sexual activity: Yes  Other Topics Concern  . Not on file  Social History Narrative   Works at [employer] Strain: Not on file  Food Insecurity: Not on file  Transportation Needs: Not on file  Physical Activity: Not on file  Stress: Not on file  Social Connections: Not on file     Family History: The patient's family history includes Other in his mother. There is no history of Allergic rhinitis, Angioedema, Asthma, Eczema, Immunodeficiency, Urticaria, Prostate cancer, or Colon cancer.  ROS:   Please see the history of present illness.     All other systems reviewed and are negative.  EKGs/Labs/Other  Studies Reviewed:    The following studies were reviewed today:   EKG:  EKG is ordered today.  The ekg ordered today demonstrates normal sinus rhythm no ST/T abnormalities, rate 72  Recent Labs: 12/29/2019: ALT 26; BUN 13; Creat 1.24; Hemoglobin 14.7; Platelets 326; Potassium 4.7; Sodium 140; TSH 3.17  Recent Lipid Panel    Component Value Date/Time   CHOL 170 12/29/2019 0853   TRIG 82 12/29/2019 0853   HDL 41 12/29/2019 0853   CHOLHDL 4.1 12/29/2019 0853   VLDL 19.0 07/04/2016 1011   LDLCALC 112 (H) 12/29/2019 0853    Physical Exam:    VS:  BP (!) 120/91 (BP Location: Right Arm, Patient Position: Sitting)   Pulse 72   Ht 6' (1.829 m)   Wt 294 lb (133.4 kg)    SpO2 99%   BMI 39.87 kg/m     Wt Readings from Last 3 Encounters:  01/12/20 294 lb (133.4 kg)  12/29/19 295 lb (133.8 kg)  08/10/18 290 lb (131.5 kg)     GEN:  Well nourished, well developed in no acute distress HEENT: Normal NECK: No JVD; No carotid bruits LYMPHATICS: No lymphadenopathy CARDIAC: RRR, no murmurs, rubs, gallops RESPIRATORY:  Clear to auscultation without rales, wheezing or rhonchi  ABDOMEN: Soft, non-tender, non-distended MUSCULOSKELETAL:  No edema; No deformity  SKIN: Warm and dry NEUROLOGIC:  Alert and oriented x 3 PSYCHIATRIC:  Normal affect   ASSESSMENT:    1. DOE (dyspnea on exertion)   2. Precordial pain    PLAN:     Chest pain: Description concerning for angina, as describes substernal chest pressure with exertion.  Recommend coronary CTA.   Dyspnea: Has noted since COVID-19 infection.  Will check echocardiogram to evaluate for myocardial involvement  RTC in 3 months   Medication Adjustments/Labs and Tests Ordered: Current medicines are reviewed at length with the patient today.  Concerns regarding medicines are outlined above.  Orders Placed This Encounter  Procedures  . CT CORONARY MORPH W/CTA COR W/SCORE W/CA W/CM &/OR WO/CM  . CT CORONARY FRACTIONAL FLOW RESERVE DATA PREP  . CT CORONARY FRACTIONAL FLOW RESERVE FLUID ANALYSIS  . Basic metabolic panel  . EKG 12-Lead  . ECHOCARDIOGRAM COMPLETE   Meds ordered this encounter  Medications  . metoprolol tartrate (LOPRESSOR) 50 MG tablet    Sig: Take within two hours of the test    Dispense:  1 tablet    Refill:  0    Patient Instructions  Medication Instructions:  No changes *If you need a refill on your cardiac medications before your next appointment, please call your pharmacy*   Lab Work: None ordered If you have labs (blood work) drawn today and your tests are completely normal, you will receive your results only by: Marland Kitchen MyChart Message (if you have MyChart) OR . A paper copy in  the mail If you have any lab test that is abnormal or we need to change your treatment, we will call you to review the results.   Testing/Procedures: Your physician has requested that you have an echocardiogram. Echocardiography is a painless test that uses sound waves to create images of your heart. It provides your doctor with information about the size and shape of your heart and how well your heart's chambers and valves are working. You may receive an ultrasound enhancing agent through an IV if needed to better visualize your heart during the echo.This procedure takes approximately one hour. There are no restrictions for this procedure. This will  take place at the [address].   Your cardiac CT will be scheduled at one of the below locations:   [facility] [address] [place], [place] [address] [phone]  [place] [address] [place], [place] [address] [phone]  If scheduled at [facility], please arrive at the [facility] main entrance of [facility] 30 minutes prior to test start time. Proceed to the [facility] Radiology Department (first floor) to check-in and test prep.  If scheduled at [facility], please arrive 15 mins early for check-in and test prep.  Please follow these instructions carefully (unless otherwise directed):  On the Night Before the Test: . Be sure to Drink plenty of water. . Do not consume any caffeinated/decaffeinated beverages or chocolate 12 hours prior to your test. . Do not take any antihistamines 12 hours prior to your test.   On the Day of the Test: . Drink plenty of water. Do not drink any water within one hour of the test. . Do not eat any food 4 hours prior to the test. . You may take your regular medications prior to the test.  . Take metoprolol (Lopressor) two hours prior to test.       After the  Test: . Drink plenty of water. . After receiving IV contrast, you may experience a mild flushed feeling. This is normal. . On occasion, you may experience a mild rash up to 24 hours after the test. This is not dangerous. If this occurs, you can take Benadryl 25 mg and increase your fluid intake. . If you experience trouble breathing, this can be serious. If it is severe call 911 IMMEDIATELY. If it is mild, please call our office. . If you take any of these medications: Glipizide/Metformin, Avandament, Glucavance, please do not take 48 hours after completing test unless otherwise instructed.   Once we have confirmed authorization from your insurance company, we will call you to set up a date and time for your test. Based on how quickly your insurance processes prior authorizations requests, please allow up to 4 weeks to be contacted for scheduling your Cardiac CT appointment. Be advised that routine Cardiac CT appointments could be scheduled as many as 8 weeks after your provider has ordered it.  For non-scheduling related questions, please contact the cardiac imaging nurse navigator should you have any questions/concerns: Marchia Bond, Cardiac Imaging Nurse Navigator Burley Saver, Interim Cardiac Imaging Nurse [place] [facility] Direct Office Dial: [phone]   For scheduling needs, including cancellations and rescheduling, please call [place], [phone].   Follow-Up: At [facility], you and your health needs are our priority.  As part of our continuing mission to provide you with exceptional heart care, we have created designated Provider Care Teams.  These Care Teams include your primary Cardiologist (physician) and Advanced Practice Providers (APPs -  Physician Assistants and Nurse Practitioners) who all work together to provide you with the care you need, when you need it.  We recommend signing up for the patient portal called "MyChart".  Sign up  information is provided on this After Visit Summary.  MyChart is used to connect with patients for Virtual Visits (Telemedicine).  Patients are able to view lab/test results, encounter notes, upcoming appointments, etc.  Non-urgent messages can be sent to your provider as well.   To learn more about what you can do with  MyChart, go to [email or web address].    Your next appointment:   3 month(s)  The format for your next appointment:   In Person  Provider:   You may see Dr. Gardiner Rhyme or one of the following Advanced Practice Providers on your designated Care Team:    Rosaria Ferries, PA-C  Jory Sims, DNP, ANP     Signed, Donato Heinz, MD  01/13/2020 10:48 PM    [place]

## 2020-01-12 ENCOUNTER — Ambulatory Visit (INDEPENDENT_AMBULATORY_CARE_PROVIDER_SITE_OTHER): Payer: 59 | Admitting: Cardiology

## 2020-01-12 ENCOUNTER — Other Ambulatory Visit: Payer: Self-pay

## 2020-01-12 ENCOUNTER — Encounter: Payer: Self-pay | Admitting: Cardiology

## 2020-01-12 VITALS — BP 120/91 | HR 72 | Ht 72.0 in | Wt 294.0 lb

## 2020-01-12 DIAGNOSIS — R072 Precordial pain: Secondary | ICD-10-CM | POA: Diagnosis not present

## 2020-01-12 DIAGNOSIS — R0600 Dyspnea, unspecified: Secondary | ICD-10-CM

## 2020-01-12 DIAGNOSIS — R0609 Other forms of dyspnea: Secondary | ICD-10-CM

## 2020-01-12 MED ORDER — METOPROLOL TARTRATE 50 MG PO TABS: ORAL_TABLET | ORAL | 0 refills | Status: DC

## 2020-01-12 NOTE — Patient Instructions (Signed)
Medication Instructions:  No changes *If you need a refill on your cardiac medications before your next appointment, please call your pharmacy*   Lab Work: None ordered If you have labs (blood work) drawn today and your tests are completely normal, you will receive your results only by: Marland Kitchen MyChart Message (if you have MyChart) OR . A paper copy in the mail If you have any lab test that is abnormal or we need to change your treatment, we will call you to review the results.   Testing/Procedures: Your physician has requested that you have an echocardiogram. Echocardiography is a painless test that uses sound waves to create images of your heart. It provides your doctor with information about the size and shape of your heart and how well your heart's chambers and valves are working. You may receive an ultrasound enhancing agent through an IV if needed to better visualize your heart during the echo.This procedure takes approximately one hour. There are no restrictions for this procedure. This will take place at the [address]   Your cardiac CT will be scheduled at one of the below locations:   [facility] [address] [place], [place] [address] [phone]  [place] [address] [place], [place] [address] [phone]  If scheduled at [facility], please arrive at the [facility] main entrance of [facility] 30 minutes prior to test start time. Proceed to the [facility] Radiology Department (first floor) to check-in and test prep.  If scheduled at [facility], please arrive 15 mins early for check-in and test prep.  Please follow these instructions carefully (unless otherwise directed):  On the Night Before the Test: . Be sure to Drink plenty of water. . Do not consume any caffeinated/decaffeinated beverages or chocolate 12 hours prior to your  test. . Do not take any antihistamines 12 hours prior to your test.   On the Day of the Test: . Drink plenty of water. Do not drink any water within one hour of the test. . Do not eat any food 4 hours prior to the test. . You may take your regular medications prior to the test.  . Take metoprolol (Lopressor) two hours prior to test.       After the Test: . Drink plenty of water. . After receiving IV contrast, you may experience a mild flushed feeling. This is normal. . On occasion, you may experience a mild rash up to 24 hours after the test. This is not dangerous. If this occurs, you can take Benadryl 25 mg and increase your fluid intake. . If you experience trouble breathing, this can be serious. If it is severe call 911 IMMEDIATELY. If it is mild, please call our office. . If you take any of these medications: Glipizide/Metformin, Avandament, Glucavance, please do not take 48 hours after completing test unless otherwise instructed.   Once we have confirmed authorization from your insurance company, we will call you to set up a date and time for your test. Based on how quickly your insurance processes prior authorizations requests, please allow up to 4 weeks to be contacted for scheduling your Cardiac CT appointment. Be advised that routine Cardiac CT appointments could be scheduled as many as 8 weeks after your provider has ordered it.  For non-scheduling related questions, please contact the cardiac imaging nurse navigator should you have any questions/concerns: Marchia Bond, Cardiac Imaging Nurse  Navigator Burley Saver, Interim Cardiac Imaging Nurse Navigator [place] [facility] Direct Office Dial: [phone]   For scheduling needs, including cancellations and rescheduling, please call [place], [phone].   Follow-Up: At [facility], you and your health needs are our priority.  As part of our continuing mission to provide you with exceptional heart care,  we have created designated Provider Care Teams.  These Care Teams include your primary Cardiologist (physician) and Advanced Practice Providers (APPs -  Physician Assistants and Nurse Practitioners) who all work together to provide you with the care you need, when you need it.  We recommend signing up for the patient portal called "MyChart".  Sign up information is provided on this After Visit Summary.  MyChart is used to connect with patients for Virtual Visits (Telemedicine).  Patients are able to view lab/test results, encounter notes, upcoming appointments, etc.  Non-urgent messages can be sent to your provider as well.   To learn more about what you can do with MyChart, go to [email or web address].    Your next appointment:   3 month(s)  The format for your next appointment:   In Person  Provider:   You may see Dr. Gardiner Rhyme or one of the following Advanced Practice Providers on your designated Care Team:    Rosaria Ferries, PA-C  Jory Sims, DNP, ANP

## 2020-01-14 ENCOUNTER — Other Ambulatory Visit: Payer: Self-pay | Admitting: *Deleted

## 2020-01-14 DIAGNOSIS — R072 Precordial pain: Secondary | ICD-10-CM

## 2020-02-02 ENCOUNTER — Other Ambulatory Visit (HOSPITAL_COMMUNITY): Payer: 59

## 2020-02-09 ENCOUNTER — Telehealth: Payer: Self-pay

## 2020-02-09 ENCOUNTER — Encounter: Payer: Self-pay | Admitting: Neurology

## 2020-02-09 ENCOUNTER — Institutional Professional Consult (permissible substitution): Payer: BC Managed Care – PPO | Admitting: Neurology

## 2020-02-09 NOTE — Telephone Encounter (Signed)
Pt no showed 02/09/20 sleep consult with Dr. Frances Furbish.

## 2020-02-26 ENCOUNTER — Encounter: Payer: Self-pay | Admitting: *Deleted

## 2020-03-01 ENCOUNTER — Other Ambulatory Visit (HOSPITAL_COMMUNITY): Payer: 59

## 2020-03-01 ENCOUNTER — Encounter (HOSPITAL_COMMUNITY): Payer: Self-pay | Admitting: Cardiology

## 2020-03-15 ENCOUNTER — Telehealth (HOSPITAL_COMMUNITY): Payer: Self-pay | Admitting: Cardiology

## 2020-03-15 NOTE — Telephone Encounter (Signed)
Just an FYI. We have made several attempts to contact this patient including sending a letter to schedule or reschedule their echocardiogram. We will be removing the patient from the echo WQ.  03/01/20 NO SHOWED -MAILED LETTER lbw       Thank you

## 2020-03-22 ENCOUNTER — Ambulatory Visit: Payer: 59 | Admitting: Cardiology

## 2020-03-22 NOTE — Progress Notes (Deleted)
Cardiology Office Note:    Date:  03/22/2020   ID:  Andrew Freeman, DOB 04-02-1980, MRN [id number]  PCP:  Andrew Motto, PA  Cardiologist:  No primary care provider on file.  Electrophysiologist:  None   Referring MD: Andrew Motto, PA   No chief complaint on file.   History of Present Illness:    Andrew Freeman is a 40 y.o. male with a hx of asthma who presents for follow-up.  He was referred by Andrew Motto, PA for evaluation of chest pain, initially seen on 01/12/2020.  Reports had COVID-19 infection in October 2020.  States he started having chest pain after Covid infection.  Describes chest pain as pressure in the center of his chest that occurs with exertion.  Climbing up stairs causes chest pain.  Also with shortness of breath.  Chest pain resolves with rest, can last up to a couple of minutes.  He denies any lightheadedness or syncope.  Does note intermittent lower extremity edema.  Denies any palpitations.  No smoking history, rare cigar use.  No history of heart disease in his immediate family  At initial clinic visit, echocardiogram and coronary CTA were ordered but have not been done.  Since last clinic visit,  Past Medical History:  Diagnosis Date  . Allergy    seasonal allergies  . Moderate persistent asthma 02/15/2015    Past Surgical History:  Procedure Laterality Date  . WISDOM TOOTH EXTRACTION  1996    Current Medications: No outpatient medications have been marked as taking for the 03/22/20 encounter (Appointment) with Little Ishikawa, MD.     Allergies:   Patient has no known allergies.   Social History   Socioeconomic History  . Marital status: Married    Spouse name: Not on file  . Number of children: Not on file  . Years of education: Not on file  . Highest education level: Not on file  Occupational History  . Not on file  Tobacco Use  . Smoking status: Never Smoker  . Smokeless tobacco: Never Used  Vaping Use  . Vaping Use:  Never used  Substance and Sexual Activity  . Alcohol use: Yes    Comment: socially  . Drug use: No  . Sexual activity: Yes  Other Topics Concern  . Not on file  Social History Narrative   Works at [employer]   Social Determinants of Health   Financial Resource Strain: Not on file  Food Insecurity: Not on file  Transportation Needs: Not on file  Physical Activity: Not on file  Stress: Not on file  Social Connections: Not on file     Family History: The patient's family history includes Other in his mother. There is no history of Allergic rhinitis, Angioedema, Asthma, Eczema, Immunodeficiency, Urticaria, Prostate cancer, or Colon cancer.  ROS:   Please see the history of present illness.     All other systems reviewed and are negative.  EKGs/Labs/Other Studies Reviewed:    The following studies were reviewed today:   EKG:  EKG is ordered today.  The ekg ordered today demonstrates normal sinus rhythm no ST/T abnormalities, rate 72  Recent Labs: 12/29/2019: ALT 26; BUN 13; Creat 1.24; Hemoglobin 14.7; Platelets 326; Potassium 4.7; Sodium 140; TSH 3.17  Recent Lipid Panel    Component Value Date/Time   CHOL 170 12/29/2019 0853   TRIG 82 12/29/2019 0853   HDL 41 12/29/2019 0853   CHOLHDL 4.1 12/29/2019 0853   VLDL 19.0 07/04/2016 1011  LDLCALC 112 (H) 12/29/2019 2878    Physical Exam:    VS:  There were no vitals taken for this visit.    Wt Readings from Last 3 Encounters:  01/12/20 294 lb (133.4 kg)  12/29/19 295 lb (133.8 kg)  08/10/18 290 lb (131.5 kg)     GEN:  Well nourished, well developed in no acute distress HEENT: Normal NECK: No JVD; No carotid bruits LYMPHATICS: No lymphadenopathy CARDIAC: RRR, no murmurs, rubs, gallops RESPIRATORY:  Clear to auscultation without rales, wheezing or rhonchi  ABDOMEN: Soft, non-tender, non-distended MUSCULOSKELETAL:  No edema; No deformity  SKIN: Warm and dry NEUROLOGIC:  Alert and oriented x 3 PSYCHIATRIC:   Normal affect   ASSESSMENT:    No diagnosis found. PLAN:     Chest pain: Description concerning for angina, as describes substernal chest pressure with exertion.  Recommend coronary CTA.   Dyspnea: Has noted since COVID-19 infection.  Will check echocardiogram to evaluate for myocardial involvement  RTC in***   Medication Adjustments/Labs and Tests Ordered: Current medicines are reviewed at length with the patient today.  Concerns regarding medicines are outlined above.  No orders of the defined types were placed in this encounter.  No orders of the defined types were placed in this encounter.   There are no Patient Instructions on file for this visit.   Signed, Little Ishikawa, MD  03/22/2020 6:15 AM    [facility]

## 2020-03-29 ENCOUNTER — Encounter: Payer: Self-pay | Admitting: Neurology

## 2020-03-29 ENCOUNTER — Institutional Professional Consult (permissible substitution): Payer: 59 | Admitting: Neurology

## 2020-10-06 ENCOUNTER — Encounter: Payer: Self-pay | Admitting: Physician Assistant

## 2020-10-06 ENCOUNTER — Ambulatory Visit (INDEPENDENT_AMBULATORY_CARE_PROVIDER_SITE_OTHER): Payer: 59 | Admitting: Physician Assistant

## 2020-10-06 ENCOUNTER — Other Ambulatory Visit: Payer: Self-pay

## 2020-10-06 VITALS — BP 120/84 | HR 73 | Temp 98.1°F | Ht 72.0 in | Wt 302.0 lb

## 2020-10-06 DIAGNOSIS — F419 Anxiety disorder, unspecified: Secondary | ICD-10-CM

## 2020-10-06 DIAGNOSIS — Z23 Encounter for immunization: Secondary | ICD-10-CM

## 2020-10-06 DIAGNOSIS — F902 Attention-deficit hyperactivity disorder, combined type: Secondary | ICD-10-CM | POA: Diagnosis not present

## 2020-10-06 MED ORDER — BUPROPION HCL ER (XL) 150 MG PO TB24: 150.0000 mg | ORAL_TABLET | Freq: Every day | ORAL | 0 refills | Status: DC

## 2020-10-06 NOTE — Progress Notes (Signed)
Andrew Freeman is a 40 y.o. male is here for follow up.  I acted as a Neurosurgeon for [employer], PA-C Corky Mull, LPN   History of Present Illness:   Chief Complaint  Patient presents with   ADHD   Anxiety    HPI  ADHD Pt would like to discuss going back on medication for ADHD due to new job. He is having trouble focusing, doing tasks. He had not been on medication since high school. Did well with adderall from what he can remember. Has been doing this job for 6-8 months and is having difficulty trying to figure out how to prioritize projects and self motivate.  Anxiety Pt has been feeling anxious x several months since starting new job. Would like to discuss medication. He was on Wellbutrin long time ago. Denies SI/HI.   Health Maintenance Due  Topic Date Due   COVID-19 Vaccine (1) Never done    Past Medical History:  Diagnosis Date   Allergy    seasonal allergies   Moderate persistent asthma 02/15/2015     Social History   Tobacco Use   Smoking status: Never   Smokeless tobacco: Never  Vaping Use   Vaping Use: Never used  Substance Use Topics   Alcohol use: Yes    Comment: socially   Drug use: No    Past Surgical History:  Procedure Laterality Date   WISDOM TOOTH EXTRACTION  1996    Family History  Problem Relation Age of Onset   Other Mother        significant GI history   Allergic rhinitis Neg Hx    Angioedema Neg Hx    Asthma Neg Hx    Eczema Neg Hx    Immunodeficiency Neg Hx    Urticaria Neg Hx    Prostate cancer Neg Hx    Colon cancer Neg Hx     PMHx, SurgHx, SocialHx, FamHx, Medications, and Allergies were reviewed in the Visit Navigator and updated as appropriate.   Patient Active Problem List   Diagnosis Date Noted   Moderate persistent asthma 02/15/2015   Allergic rhinitis 02/15/2015    Social History   Tobacco Use   Smoking status: Never   Smokeless tobacco: Never  Vaping Use   Vaping Use: Never used  Substance Use  Topics   Alcohol use: Yes    Comment: socially   Drug use: No    Current Medications and Allergies:    Current Outpatient Medications:    albuterol (PROAIR HFA) 108 (90 Base) MCG/ACT inhaler, Inhale 2 puffs into the lungs every 4 (four) hours as needed for wheezing or shortness of breath., Disp: 1 Inhaler, Rfl: 1   buPROPion (WELLBUTRIN XL) 150 MG 24 hr tablet, Take 1 tablet (150 mg total) by mouth daily., Disp: 90 tablet, Rfl: 0   diphenhydrAMINE (BENADRYL) 25 mg capsule, Take 25 mg by mouth every 6 (six) hours as needed., Disp: , Rfl:    ibuprofen (ADVIL,MOTRIN) 200 MG tablet, Take 800 mg by mouth every 6 (six) hours as needed for pain (pain). Reported on 02/15/2015, Disp: , Rfl:    loratadine (CLARITIN) 10 MG tablet, Take 10 mg by mouth as needed for allergies., Disp: , Rfl:    cyanocobalamin (,VITAMIN B-12,) 1000 MCG/ML injection, 1000 mcg (1 mg) injection once per week for four weeks, followed by 1000 mcg injection once per month. (Patient not taking: Reported on 10/06/2020), Disp: 10 mL, Rfl: 1  No Known Allergies  Review of  Systems   ROS Negative unless otherwise specified per HPI.  Vitals:   Vitals:   10/06/20 0830  BP: 120/84  Pulse: 73  Temp: 98.1 F (36.7 C)  TempSrc: Temporal  SpO2: 96%  Weight: (!) 302 lb (137 kg)  Height: 6' (1.829 m)     Body mass index is 40.96 kg/m.   Physical Exam:    Physical Exam Vitals and nursing note reviewed.  Constitutional:      General: He is not in acute distress.    Appearance: He is well-developed. He is not ill-appearing or toxic-appearing.  Cardiovascular:     Rate and Rhythm: Normal rate and regular rhythm.     Pulses: Normal pulses.     Heart sounds: Normal heart sounds, S1 normal and S2 normal.  Pulmonary:     Effort: Pulmonary effort is normal.     Breath sounds: Normal breath sounds.  Skin:    General: Skin is warm and dry.  Neurological:     Mental Status: He is alert.     GCS: GCS eye subscore is 4. GCS  verbal subscore is 5. GCS motor subscore is 6.  Psychiatric:        Speech: Speech normal.        Behavior: Behavior normal. Behavior is cooperative.     Assessment and Plan:   Attention deficit hyperactivity disorder (ADHD), combined type Uncontrolled Start Wellbutrin 150 mg daily extended release Follow-up via mychart in 4-6 weeks, sooner if concerns to check in  Anxiety Uncontrolled Possibly related to uncontrolled ADHD He has done well on Wellbutrin in the past and I think this is a reasonable good place to start. I discussed with patient that if they develop any SI, to tell someone immediately and seek medical attention.  CMA or LPN served as scribe during this visit. History, Physical, and Plan performed by medical provider. The above documentation has been reviewed and is accurate and complete.    Jarold Motto, PA-C [place] 10/06/2020  Follow-up: No follow-ups on file.

## 2021-01-01 ENCOUNTER — Other Ambulatory Visit: Payer: Self-pay | Admitting: Physician Assistant

## 2021-01-06 DIAGNOSIS — J019 Acute sinusitis, unspecified: Secondary | ICD-10-CM | POA: Diagnosis not present

## 2021-01-08 ENCOUNTER — Encounter: Payer: Self-pay | Admitting: Physician Assistant

## 2021-01-11 ENCOUNTER — Other Ambulatory Visit: Payer: Self-pay | Admitting: Physician Assistant

## 2021-01-11 MED ORDER — BUPROPION HCL ER (XL) 300 MG PO TB24: 300.0000 mg | ORAL_TABLET | Freq: Every day | ORAL | 1 refills | Status: DC

## 2021-02-17 DIAGNOSIS — Z87440 Personal history of urinary (tract) infections: Secondary | ICD-10-CM | POA: Diagnosis not present

## 2021-02-17 DIAGNOSIS — R109 Unspecified abdominal pain: Secondary | ICD-10-CM | POA: Diagnosis not present

## 2021-02-17 DIAGNOSIS — N200 Calculus of kidney: Secondary | ICD-10-CM | POA: Diagnosis not present

## 2021-02-17 DIAGNOSIS — N132 Hydronephrosis with renal and ureteral calculous obstruction: Secondary | ICD-10-CM | POA: Diagnosis not present

## 2021-02-17 DIAGNOSIS — U071 COVID-19: Secondary | ICD-10-CM | POA: Diagnosis not present

## 2021-10-17 ENCOUNTER — Ambulatory Visit (INDEPENDENT_AMBULATORY_CARE_PROVIDER_SITE_OTHER): Payer: 59 | Admitting: Physician Assistant

## 2021-10-17 ENCOUNTER — Encounter: Payer: Self-pay | Admitting: Physician Assistant

## 2021-10-17 VITALS — BP 130/90 | HR 100 | Temp 98.2°F | Ht 72.0 in | Wt 296.2 lb

## 2021-10-17 DIAGNOSIS — Z0001 Encounter for general adult medical examination with abnormal findings: Secondary | ICD-10-CM | POA: Diagnosis not present

## 2021-10-17 DIAGNOSIS — E669 Obesity, unspecified: Secondary | ICD-10-CM

## 2021-10-17 DIAGNOSIS — L918 Other hypertrophic disorders of the skin: Secondary | ICD-10-CM

## 2021-10-17 DIAGNOSIS — Z0000 Encounter for general adult medical examination without abnormal findings: Secondary | ICD-10-CM

## 2021-10-17 DIAGNOSIS — F419 Anxiety disorder, unspecified: Secondary | ICD-10-CM

## 2021-10-17 DIAGNOSIS — F32A Depression, unspecified: Secondary | ICD-10-CM

## 2021-10-17 MED ORDER — CITALOPRAM HYDROBROMIDE 10 MG PO TABS: 10.0000 mg | ORAL_TABLET | Freq: Every day | ORAL | 3 refills | Status: DC

## 2021-10-17 NOTE — Progress Notes (Signed)
Subjective:    Andrew Freeman is a 41 y.o. male and is here for a comprehensive physical exam.  HPI  Health Maintenance Due  Topic Date Due   COVID-19 Vaccine (1) Never done    Acute Concerns: Skin tag -- has  large skin tag on his back that is bothersome. Rubs on his clothes and can hurt at times. Would like removed.  Chronic Issues: Anxiety and Depression -- currently taking Wellbutrin 300 mg daily and tolerating but has had increase in depression. Tried prozac in the past but this caused ED. Denies SI/HI. Would like a change in med.  Health Maintenance: Diet -- He does not have a well balanced diet. Sleep habits --  He does not sleep well and get about 3-4 hours at night. He experiences night terrors about 2-3x a week. Melatonin does not help him sleep better and takes Benadryl to manage sleep. Exercise -- He does not participate in regular exercise.   Weight -- Patient's current weight is 302 lbs this visit. Recent weight history Wt Readings from Last 10 Encounters:  10/17/21 296 lb 4 oz (134.4 kg)  10/06/20 (!) 302 lb (137 kg)  01/12/20 294 lb (133.4 kg)  12/29/19 295 lb (133.8 kg)  08/10/18 290 lb (131.5 kg)  09/17/17 294 lb (133.4 kg)  12/07/16 287 lb 6.4 oz (130.4 kg)  09/25/16 282 lb (127.9 kg)  07/04/16 283 lb 6.1 oz (128.5 kg)  08/02/12 265 lb (120.2 kg)   Body mass index is 40.18 kg/m.  Mood -- Patient reports to be depressed this visit and he is still currently taking 300 mg Wellbutrin  Alcohol use --  reports current alcohol use.  Tobacco use --  Tobacco Use: Low Risk  (10/17/2021)   Patient History    Smoking Tobacco Use: Never    Smokeless Tobacco Use: Never    Passive Exposure: Not on file    UTD with eye doctor? He is UTD on vision care. UTD with dentist? He is not UTD on dental care.     10/17/2021    2:41 PM  Depression screen PHQ 2/9  Decreased Interest 1  Down, Depressed, Hopeless 2  PHQ - 2 Score 3  Altered sleeping 2  Tired,  decreased energy 3  Change in appetite 2  Feeling bad or failure about yourself  3  Trouble concentrating 2  Moving slowly or fidgety/restless 2  Suicidal thoughts 0  PHQ-9 Score 17  Difficult doing work/chores Very difficult    Other providers/specialists: Patient Care Team: Jarold Motto, [place] as PCP - General (Physician Assistant)    PMHx, SurgHx, SocialHx, Medications, and Allergies were reviewed in the Visit Navigator and updated as appropriate.   Past Medical History:  Diagnosis Date   Allergy    seasonal allergies   Moderate persistent asthma 02/15/2015     Past Surgical History:  Procedure Laterality Date   WISDOM TOOTH EXTRACTION  1996     Family History  Problem Relation Age of Onset   Other Mother        significant GI history   Liver disease Father    Prostate cancer Maternal Grandfather    Allergic rhinitis Neg Hx    Angioedema Neg Hx    Asthma Neg Hx    Eczema Neg Hx    Immunodeficiency Neg Hx    Urticaria Neg Hx    Colon cancer Neg Hx     Social History   Tobacco Use   Smoking status:  Never   Smokeless tobacco: Never  Vaping Use   Vaping Use: Never used  Substance Use Topics   Alcohol use: Yes    Comment: socially   Drug use: No    Review of Systems:   Review of Systems  Constitutional:  Negative for chills, fever, malaise/fatigue and weight loss.  HENT:  Negative for hearing loss, sinus pain and sore throat.   Respiratory:  Negative for cough and hemoptysis.   Cardiovascular:  Negative for chest pain, palpitations, leg swelling and PND.  Gastrointestinal:  Negative for abdominal pain, constipation, diarrhea, heartburn, nausea and vomiting.  Genitourinary:  Negative for dysuria, frequency and urgency.  Musculoskeletal:  Negative for back pain, myalgias and neck pain.  Skin:  Negative for itching and rash.  Endo/Heme/Allergies:  Negative for polydipsia.  Psychiatric/Behavioral:  Negative for depression. The patient is not  nervous/anxious.     Objective:    Vitals:   10/17/21 1429 10/17/21 1501  BP: (!) 126/90 (!) 130/90  Pulse: 100   Temp: 98.2 F (36.8 C)   SpO2: 97%     Body mass index is 40.18 kg/m.  General  Alert, cooperative, no distress, appears stated age  Head:  Normocephalic, without obvious abnormality, atraumatic  Eyes:  PERRL, conjunctiva/corneas clear, EOM's intact, fundi benign, both eyes       Ears:  Normal TM's and external ear canals, both ears  Nose: Nares normal, septum midline, mucosa normal, no drainage or sinus tenderness  Throat: Lips, mucosa, and tongue normal; teeth and gums normal  Neck: Supple, symmetrical, trachea midline, no adenopathy;     thyroid:  No enlargement/tenderness/nodules; no carotid bruit or JVD  Back:   Symmetric, no curvature, ROM normal, no CVA tenderness  Lungs:   Clear to auscultation bilaterally, respirations unlabored  Chest wall:  No tenderness or deformity  Heart:  Regular rate and rhythm, S1 and S2 normal, no murmur, rub or gallop  Abdomen:   Soft, non-tender, bowel sounds active all four quadrants, no masses, no organomegaly  Extremities: Extremities normal, atraumatic, no cyanosis or edema  Prostate : Deferred   Skin: Skin color, texture, turgor normal, no rashes  Approximately 1/2 cm skin tag to middle of upper back  Lymph nodes: Cervical, supraclavicular, and axillary nodes normal  Neurologic: CNII-XII grossly intact. Normal strength, sensation and reflexes throughout   Procedure: Skin Tag Removal  Consent:  Risks and benefits of therapy discussed with patient who voices understanding and agrees with planned care. No barriers to communication or understanding identified.  After obtaining informed consent, the patient's identity, procedure, and site were verified during a pause prior to proceeding with the minor surgical procedure as per universal protocol recommendations.    Meds, vitals, and allergies reviewed.   Area was cleaned  with betadine and alcohol swab. Approximately 1/2 cc of 2% lidocaine with epi injected into base of skin tag. Lesion was then shaved and removed with forceps.  Number of skin tags removed and location: 1 on middle upper back approx 1/2 cm  Approximate loss of bleeding is less than 1cc.  Silver nitrate applied to area. Patient tolerated procedure well without any apparent complications. Sterile bandage placed.     AssessmentPlan:   Routine physical examination Today patient counseled on age appropriate routine health concerns for screening and prevention, each reviewed and up to date or declined. Immunizations reviewed and up to date or declined. Labs ordered and reviewed. Risk factors for depression reviewed and negative. Hearing function and visual acuity  are intact. ADLs screened and addressed as needed. Functional ability and level of safety reviewed and appropriate. Education, counseling and referrals performed based on assessed risks today. Patient provided with a copy of personalized plan for preventive services.  Anxiety and depression Uncontrolled Start celexa 10 mg daily Continue wellbutrin 300 mg daily Send mychart message in 1 month to check in, sooner if  concerns I discussed with patient that if they develop any SI, to tell someone immediately and seek medical attention.   Obesity, unspecified classification, unspecified obesity type, unspecified whether serious comorbidity present Update blood work and provide recommendations  Referral to [employer] tag Aftercare, including wound care, risks of bleeding, and risks of recurrence were discussed. All questions answered.  Return for retreatment as needed for further evaluation and management.     I,Kalven Ganim,acting as a Neurosurgeon for [employer], PA.,have documented all relevant documentation on the behalf of Jarold Motto, PA,as directed by  Jarold Motto, PA while in the presence of Jarold Motto,  [place].   Jarold Motto, PA-C [facility]

## 2021-10-17 NOTE — Patient Instructions (Addendum)
It was great to see you!  I am placing a referral for you to [facility] for weight loss. They are fantastic! They will call you.  Please go to the lab for blood work.   Our office will call you with your results unless you have chosen to receive results via MyChart.  If your blood work is normal we will follow-up each year for physicals and as scheduled for chronic medical problems.  If anything is abnormal we will treat accordingly and get you in for a follow-up.  Take care,  Aldona Bar

## 2021-10-18 ENCOUNTER — Encounter: Payer: Self-pay | Admitting: Physician Assistant

## 2021-10-18 ENCOUNTER — Other Ambulatory Visit: Payer: Self-pay | Admitting: Physician Assistant

## 2021-10-18 DIAGNOSIS — D72829 Elevated white blood cell count, unspecified: Secondary | ICD-10-CM

## 2021-10-18 LAB — LIPID PANEL
Cholesterol: 142 mg/dL (ref 0–200)
HDL: 37.5 mg/dL — ABNORMAL LOW (ref >39.00)
LDL Cholesterol: 81 mg/dL (ref 0–99)
NonHDL: 104.71
Total CHOL/HDL Ratio: 4
Triglycerides: 120 mg/dL (ref 0.0–149.0)
VLDL: 24 mg/dL (ref 0.0–40.0)

## 2021-10-18 LAB — CBC WITH DIFFERENTIAL/PLATELET
Basophils Absolute: 0.1 10*3/uL (ref 0.0–0.1)
Basophils Relative: 0.6 % (ref 0.0–3.0)
Eosinophils Absolute: 0.2 10*3/uL (ref 0.0–0.7)
Eosinophils Relative: 1.4 % (ref 0.0–5.0)
HCT: 41.4 % (ref 39.0–52.0)
Hemoglobin: 14.2 g/dL (ref 13.0–17.0)
Lymphocytes Relative: 14 % (ref 12.0–46.0)
Lymphs Abs: 2.1 10*3/uL (ref 0.7–4.0)
MCHC: 34.2 g/dL (ref 30.0–36.0)
MCV: 87.6 fl (ref 78.0–100.0)
Monocytes Absolute: 1 10*3/uL (ref 0.1–1.0)
Monocytes Relative: 7.1 % (ref 3.0–12.0)
Neutro Abs: 11.3 10*3/uL — ABNORMAL HIGH (ref 1.4–7.7)
Neutrophils Relative %: 76.9 % (ref 43.0–77.0)
Platelets: 349 10*3/uL (ref 150.0–400.0)
RBC: 4.73 Mil/uL (ref 4.22–5.81)
RDW: 14.8 % (ref 11.5–15.5)
WBC: 14.7 10*3/uL — ABNORMAL HIGH (ref 4.0–10.5)

## 2021-10-18 LAB — COMPREHENSIVE METABOLIC PANEL
ALT: 20 U/L (ref 0–53)
AST: 18 U/L (ref 0–37)
Albumin: 4.4 g/dL (ref 3.5–5.2)
Alkaline Phosphatase: 63 U/L (ref 39–117)
BUN: 11 mg/dL (ref 6–23)
CO2: 29 mEq/L (ref 19–32)
Calcium: 9.6 mg/dL (ref 8.4–10.5)
Chloride: 102 mEq/L (ref 96–112)
Creatinine, Ser: 1.35 mg/dL (ref 0.40–1.50)
GFR: 66.04 mL/min (ref >60.00)
Glucose, Bld: 85 mg/dL (ref 70–99)
Potassium: 4.3 mEq/L (ref 3.5–5.1)
Sodium: 139 mEq/L (ref 135–145)
Total Bilirubin: 0.5 mg/dL (ref 0.2–1.2)
Total Protein: 7.9 g/dL (ref 6.0–8.3)

## 2021-10-19 ENCOUNTER — Other Ambulatory Visit: Payer: Self-pay | Admitting: Physician Assistant

## 2021-10-19 DIAGNOSIS — R5383 Other fatigue: Secondary | ICD-10-CM

## 2021-10-24 ENCOUNTER — Other Ambulatory Visit (INDEPENDENT_AMBULATORY_CARE_PROVIDER_SITE_OTHER): Payer: 59

## 2021-10-24 DIAGNOSIS — D72829 Elevated white blood cell count, unspecified: Secondary | ICD-10-CM

## 2021-10-24 DIAGNOSIS — R5383 Other fatigue: Secondary | ICD-10-CM | POA: Diagnosis not present

## 2021-10-24 DIAGNOSIS — E538 Deficiency of other specified B group vitamins: Secondary | ICD-10-CM | POA: Insufficient documentation

## 2021-10-24 LAB — CBC WITH DIFFERENTIAL/PLATELET
Basophils Absolute: 0.1 10*3/uL (ref 0.0–0.1)
Basophils Relative: 0.8 % (ref 0.0–3.0)
Eosinophils Absolute: 0.2 10*3/uL (ref 0.0–0.7)
Eosinophils Relative: 3.1 % (ref 0.0–5.0)
HCT: 40.3 % (ref 39.0–52.0)
Hemoglobin: 13.6 g/dL (ref 13.0–17.0)
Lymphocytes Relative: 22.4 % (ref 12.0–46.0)
Lymphs Abs: 1.7 10*3/uL (ref 0.7–4.0)
MCHC: 33.6 g/dL (ref 30.0–36.0)
MCV: 87 fl (ref 78.0–100.0)
Monocytes Absolute: 0.6 10*3/uL (ref 0.1–1.0)
Monocytes Relative: 8.6 % (ref 3.0–12.0)
Neutro Abs: 4.9 10*3/uL (ref 1.4–7.7)
Neutrophils Relative %: 65.1 % (ref 43.0–77.0)
Platelets: 344 10*3/uL (ref 150.0–400.0)
RBC: 4.64 Mil/uL (ref 4.22–5.81)
RDW: 14.4 % (ref 11.5–15.5)
WBC: 7.5 10*3/uL (ref 4.0–10.5)

## 2021-10-24 LAB — VITAMIN D 25 HYDROXY (VIT D DEFICIENCY, FRACTURES): VITD: 25.99 ng/mL — ABNORMAL LOW (ref 30.00–100.00)

## 2021-10-24 LAB — VITAMIN B12: Vitamin B-12: 197 pg/mL — ABNORMAL LOW (ref 211–911)

## 2021-10-28 ENCOUNTER — Other Ambulatory Visit: Payer: Self-pay | Admitting: *Deleted

## 2021-10-28 DIAGNOSIS — E538 Deficiency of other specified B group vitamins: Secondary | ICD-10-CM

## 2021-10-28 MED ORDER — CYANOCOBALAMIN 1000 MCG/ML IJ SOLN: INTRAMUSCULAR | 1 refills | Status: DC

## 2021-10-28 MED ORDER — "BD LUER-LOK SYRINGE 25G X 1"" 3 ML MISC": 0 refills | Status: AC

## 2021-11-08 ENCOUNTER — Other Ambulatory Visit: Payer: Self-pay | Admitting: Physician Assistant

## 2021-11-21 ENCOUNTER — Encounter: Payer: Self-pay | Admitting: Physician Assistant

## 2021-11-21 MED ORDER — BUPROPION HCL ER (XL) 300 MG PO TB24: 300.0000 mg | ORAL_TABLET | Freq: Every day | ORAL | 1 refills | Status: DC

## 2021-11-28 ENCOUNTER — Other Ambulatory Visit: Payer: Self-pay | Admitting: Physician Assistant

## 2021-12-07 ENCOUNTER — Ambulatory Visit (INDEPENDENT_AMBULATORY_CARE_PROVIDER_SITE_OTHER): Payer: 59 | Admitting: Physician Assistant

## 2021-12-07 ENCOUNTER — Encounter: Payer: Self-pay | Admitting: Physician Assistant

## 2021-12-07 VITALS — BP 138/98 | HR 65 | Temp 97.7°F | Ht 72.0 in | Wt 288.4 lb

## 2021-12-07 DIAGNOSIS — F32A Depression, unspecified: Secondary | ICD-10-CM

## 2021-12-07 DIAGNOSIS — R55 Syncope and collapse: Secondary | ICD-10-CM

## 2021-12-07 DIAGNOSIS — S060X1A Concussion with loss of consciousness of 30 minutes or less, initial encounter: Secondary | ICD-10-CM | POA: Diagnosis not present

## 2021-12-07 DIAGNOSIS — F419 Anxiety disorder, unspecified: Secondary | ICD-10-CM

## 2021-12-07 LAB — COMPREHENSIVE METABOLIC PANEL
ALT: 19 U/L (ref 0–53)
AST: 15 U/L (ref 0–37)
Albumin: 4.5 g/dL (ref 3.5–5.2)
Alkaline Phosphatase: 70 U/L (ref 39–117)
BUN: 12 mg/dL (ref 6–23)
CO2: 28 mEq/L (ref 19–32)
Calcium: 9.4 mg/dL (ref 8.4–10.5)
Chloride: 105 mEq/L (ref 96–112)
Creatinine, Ser: 1.4 mg/dL (ref 0.40–1.50)
GFR: 63.16 mL/min (ref >60.00)
Glucose, Bld: 80 mg/dL (ref 70–99)
Potassium: 4.1 mEq/L (ref 3.5–5.1)
Sodium: 140 mEq/L (ref 135–145)
Total Bilirubin: 0.5 mg/dL (ref 0.2–1.2)
Total Protein: 7.4 g/dL (ref 6.0–8.3)

## 2021-12-07 LAB — CBC WITH DIFFERENTIAL/PLATELET
Basophils Absolute: 0.1 10*3/uL (ref 0.0–0.1)
Basophils Relative: 0.8 % (ref 0.0–3.0)
Eosinophils Absolute: 0.2 10*3/uL (ref 0.0–0.7)
Eosinophils Relative: 2.7 % (ref 0.0–5.0)
HCT: 41.4 % (ref 39.0–52.0)
Hemoglobin: 14.4 g/dL (ref 13.0–17.0)
Lymphocytes Relative: 23.8 % (ref 12.0–46.0)
Lymphs Abs: 2 10*3/uL (ref 0.7–4.0)
MCHC: 34.8 g/dL (ref 30.0–36.0)
MCV: 86.3 fl (ref 78.0–100.0)
Monocytes Absolute: 0.7 10*3/uL (ref 0.1–1.0)
Monocytes Relative: 7.8 % (ref 3.0–12.0)
Neutro Abs: 5.4 10*3/uL (ref 1.4–7.7)
Neutrophils Relative %: 64.9 % (ref 43.0–77.0)
Platelets: 372 10*3/uL (ref 150.0–400.0)
RBC: 4.8 Mil/uL (ref 4.22–5.81)
RDW: 14.1 % (ref 11.5–15.5)
WBC: 8.4 10*3/uL (ref 4.0–10.5)

## 2021-12-07 LAB — TSH: TSH: 2.6 u[IU]/mL (ref 0.35–5.50)

## 2021-12-07 MED ORDER — SERTRALINE HCL 25 MG PO TABS: 25.0000 mg | ORAL_TABLET | Freq: Every day | ORAL | 1 refills | Status: DC

## 2021-12-07 NOTE — Progress Notes (Signed)
Andrew Freeman is a 41 y.o. male here for a new problem.  History of Present Illness:   Chief Complaint  Patient presents with   Follow-up    Pt is here for f/u from ED visit on 12/05/2021 at [facility].  He had 2 episodes of syncope on the plane. Pt is still c/o lightheadedness and headache.    HPI  Syncope; Concussion Patient reports that he was on his way to [place] for work on a flight two days ago (11/27) when he began feeling hot and dizzy around 7pm. He states that he had eaten and drank regularly before incident with no alcohol. Patient explains that the last thing he remembers was the flight attendant telling him someone was using the restroom as he was trying to use the bathroom. He then remembers people standing over him on the flight and checking his vitals which were low, his blood pressure was 70/30. When patient first regained consciousness his spouse told of how he couldn't remember his name, where he was, or the year. He continues reporting that people assisted him to stand up to observe how he'd do and he passed out after 10 seconds hitting his head on the way down. The plane was re-routed and he was sent to the ER in [place]. While in the hospital, they ran EKGs, a chest xray, bloodwork, and a CT scan of his head. All came back normal. They recommended that he follow up with his PCP in 48 hours.   Patient is complaining of a lingering headache and feeling unsteady. He reports that he experiences dizziness and an unstable gait when he stands with his eyes closed. He states that an incident like this happened three weeks prior. Patient stepped out of a jacuzzi and was drying off when he suddenly turned white and passed out. He did not see his PCP for this.   Patient reports that he drove to the office today, but states that he will be allowing his wife to drive him around today. He states that he has been eating and drinking regularly since the incident. Patient explains that he  feels okay for the most part, his only complaint is his headache. He reports that he has no machine to check his blood pressure.   His wife reports that these syncope episodes began after starting Celexa. His wife also explains that the patient did not see his cardiologist, Dr. Gardiner Rhyme, for a follow up for his chest pain.  Patient expresses that he usually doesn't have problems with flying and does not have flight anxiety because he travels a lot. He denies facial numbness and tingling sensation in arms and legs. Denies vision changes.  Anxiety and Depression Currently taking Wellbutrin 300 mg daily and Celexa 10 mg daily. Wife is concerned that the Celexa could be contributing to syncope. Feels like the medication has been beneficial. Denies SI/HI.  Past Medical History:  Diagnosis Date   Allergy    seasonal allergies   Moderate persistent asthma 02/15/2015     Social History   Tobacco Use   Smoking status: Never   Smokeless tobacco: Never  Vaping Use   Vaping Use: Never used  Substance Use Topics   Alcohol use: Yes    Comment: socially   Drug use: No    Past Surgical History:  Procedure Laterality Date   WISDOM TOOTH EXTRACTION  1996    Family History  Problem Relation Age of Onset   Other Mother  significant GI history   Liver disease Father    Prostate cancer Maternal Grandfather    Allergic rhinitis Neg Hx    Angioedema Neg Hx    Asthma Neg Hx    Eczema Neg Hx    Immunodeficiency Neg Hx    Urticaria Neg Hx    Colon cancer Neg Hx     No Known Allergies  Current Medications:   Current Outpatient Medications:    albuterol (PROAIR HFA) 108 (90 Base) MCG/ACT inhaler, Inhale 2 puffs into the lungs every 4 (four) hours as needed for wheezing or shortness of breath., Disp: 1 Inhaler, Rfl: 1   buPROPion (WELLBUTRIN XL) 300 MG 24 hr tablet, Take 1 tablet (300 mg total) by mouth daily., Disp: 90 tablet, Rfl: 1   cetirizine (ZYRTEC) 10 MG tablet, Take 10 mg by  mouth daily., Disp: , Rfl:    cyanocobalamin (VITAMIN B12) 1000 MCG/ML injection, INJECT 1 ML OF VIT B12 INTO SKIN ONCE A WEEK X 4 WEEKS, THEN ONCE A MONTH X 3 MONTHS., Disp: 12 mL, Rfl: 1   diphenhydrAMINE (BENADRYL) 25 mg capsule, Take 25 mg by mouth every 6 (six) hours as needed., Disp: , Rfl:    ibuprofen (ADVIL,MOTRIN) 200 MG tablet, Take 800 mg by mouth every 6 (six) hours as needed for pain (pain). Reported on 02/15/2015, Disp: , Rfl:    ipratropium (ATROVENT) 0.03 % nasal spray, Place into the nose., Disp: , Rfl:    loratadine (CLARITIN) 10 MG tablet, Take 10 mg by mouth as needed for allergies., Disp: , Rfl:    sertraline (ZOLOFT) 25 MG tablet, Take 1 tablet (25 mg total) by mouth daily., Disp: 30 tablet, Rfl: 1   SYRINGE-NEEDLE, DISP, 3 ML (B-D 3CC LUER-LOK SYR 25GX1") 25G X 1" 3 ML MISC, Use to inject Vit B12., Disp: 7 each, Rfl: 0   Review of Systems:   Review of Systems  Neurological:  Positive for headaches. Negative for tingling.    Vitals:   Vitals:   12/07/21 0842 12/07/21 0907  BP: (!) 136/100 (!) 138/98  Pulse: 65   Temp: 97.7 F (36.5 C)   TempSrc: Temporal   SpO2: 97%   Weight: 288 lb 6.1 oz (130.8 kg)   Height: 6' (1.829 m)      Body mass index is 39.11 kg/m.  Physical Exam:   Physical Exam Constitutional:      General: He is not in acute distress.    Appearance: Normal appearance. He is not ill-appearing.  HENT:     Head: Normocephalic and atraumatic.     Right Ear: External ear normal.     Left Ear: External ear normal.  Eyes:     Extraocular Movements: Extraocular movements intact.     Pupils: Pupils are equal, round, and reactive to light.  Cardiovascular:     Rate and Rhythm: Normal rate and regular rhythm.     Heart sounds: Normal heart sounds. No murmur heard.    No gallop.  Pulmonary:     Effort: Pulmonary effort is normal. No respiratory distress.     Breath sounds: Normal breath sounds. No wheezing or rales.  Skin:    General: Skin is  warm and dry.  Neurological:     General: No focal deficit present.     Mental Status: He is alert and oriented to person, place, and time.     Cranial Nerves: Cranial nerves 2-12 are intact.     Sensory: Sensation is intact.  Motor: Motor function is intact.     Coordination: Coordination is intact.     Gait: Gait is intact.  Psychiatric:        Judgment: Judgment normal.     Assessment and Plan:   Syncope, unspecified syncope type No red flags on today's exam Unfortunately I have no access to ED records Will update CBC, CMP and TSH We discussed the need to follow-up with Dr. Gardiner Rhyme (cardiology) ASAP to get back on track with prior cardiology follow-up needs and to discuss this If any recurrent/new sx, needs ER evaluation Will change celexa to zoloft out of abundance of caution  Concussion with loss of consciousness of 30 minutes or less, initial encounter No red flags on today's exam Discussed need for cognitive rest Discussed possible need to follow-up with Sports Medicine for Concussion [facility] if symptoms do not improve Follow-up if new/worsening sx  Anxiety and depression Well controlled Continue Wellbutrin 300 mg XL daily Stop celexa and start zoloft 25 mg daily Follow-up in 6 months, sooner if concerns  I,Verona Buck,acting as a scribe for [employer], PA.,have documented all relevant documentation on the behalf of Inda Coke, PA,as directed by  Inda Coke, PA while in the presence of Inda Coke, [place].  I, Inda Coke, [place], have reviewed all documentation for this visit. The documentation on 12/07/21 for the exam, diagnosis, procedures, and orders are all accurate and complete.   Inda Coke, PA-C

## 2021-12-07 NOTE — Patient Instructions (Addendum)
It was great to see you!  Please call Dr. Bjorn Pippin and follow-up regarding your syncopal event.  Stop celexa and start zoloft 25 mg out of an abundance of caution.  Push fluids and rest.  Take care,  Jarold Motto PA-C

## 2021-12-08 ENCOUNTER — Ambulatory Visit: Payer: 59 | Attending: Cardiology

## 2021-12-08 ENCOUNTER — Ambulatory Visit: Payer: 59 | Attending: Cardiology | Admitting: Cardiology

## 2021-12-08 ENCOUNTER — Encounter: Payer: Self-pay | Admitting: Cardiology

## 2021-12-08 VITALS — BP 155/108 | HR 96 | Ht 72.0 in | Wt 289.0 lb

## 2021-12-08 DIAGNOSIS — R55 Syncope and collapse: Secondary | ICD-10-CM | POA: Diagnosis not present

## 2021-12-08 DIAGNOSIS — R072 Precordial pain: Secondary | ICD-10-CM | POA: Diagnosis not present

## 2021-12-08 NOTE — Patient Instructions (Signed)
Medication Instructions:  Your physician recommends that you continue on your current medications as directed. Please refer to the Current Medication list given to you today.  *If you need a refill on your cardiac medications before your next appointment, please call your pharmacy*  Testing/Procedures: Your physician has requested that you have an echocardiogram. Echocardiography is a painless test that uses sound waves to create images of your heart. It provides your doctor with information about the size and shape of your heart and how well your heart's chambers and valves are working. This procedure takes approximately one hour. There are no restrictions for this procedure. Please do NOT wear cologne, perfume, aftershave, or lotions (deodorant is allowed). Please arrive 15 minutes prior to your appointment time.  ZIO XT- Long Term Monitor Instructions   Your physician has requested you wear a ZIO patch monitor for _14_ days.  This is a single patch monitor.   [employer] supplies one patch monitor per enrollment. Additional stickers are not available. Please do not apply patch if you will be having a Nuclear Stress Test, Echocardiogram, Cardiac CT, MRI, or Chest Xray during the period you would be wearing the monitor. The patch cannot be worn during these tests. You cannot remove and re-apply the ZIO XT patch monitor.  Your ZIO patch monitor will be sent Fed Ex from [employer] directly to your home address. It may take 3-5 days to receive your monitor after you have been enrolled.  Once you have received your monitor, please review the enclosed instructions. Your monitor has already been registered assigning a specific monitor serial # to you.  Billing and Patient Assistance Program Information   We have supplied [employer] with any of your insurance information on file for billing purposes. [employer] offers a sliding scale Patient Assistance Program for patients that do not have insurance,  or whose insurance does not completely cover the cost of the ZIO monitor.   You must apply for the Patient Assistance Program to qualify for this discounted rate.     To apply, please call [employer] at [phone], select option 4, then select option 2, and ask to apply for Patient Assistance Program.  Theodore Demark will ask your household income, and how many people are in your household.  They will quote your out-of-pocket cost based on that information.  [employer] will also be able to set up a 27-month interest-free payment plan if needed.  Applying the monitor   Shave hair from upper left chest.  Hold abrader disc by orange tab. Rub abrader in 40 strokes over the upper left chest as indicated in your monitor instructions.  Clean area with 4 enclosed alcohol pads. Let dry.  Apply patch as indicated in monitor instructions. Patch will be placed under collarbone on left side of chest with arrow pointing upward.  Rub patch adhesive wings for 2 minutes. Remove white label marked "1". Remove the white label marked "2". Rub patch adhesive wings for 2 additional minutes.  While looking in a mirror, press and release button in center of patch. A small green light will flash 3-4 times. This will be your only indicator that the monitor has been turned on. ?  Do not shower for the first 24 hours. You may shower after the first 24 hours.  Press the button if you feel a symptom. You will hear a small click. Record Date, Time and Symptom in the Patient Logbook.  When you are ready to remove the patch, follow instructions on the last  2 pages of the Patient Logbook. Stick patch monitor onto the last page of Patient Logbook.  Place Patient Logbook in the blue and white box.  Use locking tab on box and tape box closed securely.  The blue and white box has prepaid postage on it. Please place it in the mailbox as soon as possible. Your physician should have your test results approximately 7 days after the monitor has been  mailed back to [facility].  Call [facility] Customer Care at [phone] if you have questions regarding your ZIO XT patch monitor. Call them immediately if you see an orange light blinking on your monitor.  If your monitor falls off in less than 4 days, contact our Monitor department at [phone]. ?If your monitor becomes loose or falls off after 4 days call [employer] at [phone] for suggestions on securing your monitor.?  Follow-Up: At [facility], you and your health needs are our priority.  As part of our continuing mission to provide you with exceptional heart care, we have created designated Provider Care Teams.  These Care Teams include your primary Cardiologist (physician) and Advanced Practice Providers (APPs -  Physician Assistants and Nurse Practitioners) who all work together to provide you with the care you need, when you need it.  We recommend signing up for the patient portal called "MyChart".  Sign up information is provided on this After Visit Summary.  MyChart is used to connect with patients for Virtual Visits (Telemedicine).  Patients are able to view lab/test results, encounter notes, upcoming appointments, etc.  Non-urgent messages can be sent to your provider as well.   To learn more about what you can do with MyChart, go to [email or web address].    Your next appointment:   3 month(s)  The format for your next appointment:   In Person  Provider:   Dr. Bjorn Pippin

## 2021-12-08 NOTE — Progress Notes (Unsigned)
Enrolled for [employer] to mail a ZIO XT long term holter monitor to the patients address on file.  

## 2021-12-08 NOTE — Progress Notes (Signed)
Cardiology Office Note:    Date:  12/08/2021   ID:  Andrew Freeman, DOB 14-Dec-1980, MRN [id number]  PCP:  Jarold MottoWorley, Samantha, PA  Cardiologist:  None  Electrophysiologist:  None   Referring MD: Jarold MottoWorley, Samantha, PA   Chief Complaint  Patient presents with   Loss of Consciousness    History of Present Illness:    Andrew Freeman is a 41 y.o. male with a hx of asthma who presents for follow-up.  He was referred by Jarold MottoSamantha Worley, PA for evaluation of chest pain, initially seen 01/12/20.  Reports had COVID-19 infection in October 2020.  States he started having chest pain after Covid infection.   At initial clinic visit, coronary CTA and echo were ordered but neither was done.  Since last clinic visit, he reports chest pain and dyspnea resolved.  States that he was flying to [place] recently and started feeling lightheaded and diaphoretic, and then felt nauseated.  He tried to stand up and walk to the front of the plane and then passed out.  BP was 70s over 30s and he was pale and diaphoretic.  He tried to stand up and passed out again.  They diverted the plane and landed in [place].  [facility]  Evaluation in the ED included head CT which was unremarkable.  He reports had another passing out spell after being in a hot tub for hours and then started having same symptoms of lightheadedness and diaphoresis and then passed out.  He had recently started Celexa but stopped taking.  For exercise he reports he goes for walks, denies any chest pain or dyspnea.  Does report some lower extremity edema.  Denies any palpitations.  Reports recently has noted lightheadedness when going from lying to sitting.     Past Medical History:  Diagnosis Date   Allergy    seasonal allergies   Moderate persistent asthma 02/15/2015    Past Surgical History:  Procedure Laterality Date   WISDOM TOOTH EXTRACTION  1996    Current Medications: Current Meds  Medication Sig   albuterol (PROAIR  HFA) 108 (90 Base) MCG/ACT inhaler Inhale 2 puffs into the lungs every 4 (four) hours as needed for wheezing or shortness of breath.   buPROPion (WELLBUTRIN XL) 300 MG 24 hr tablet Take 1 tablet (300 mg total) by mouth daily.   cetirizine (ZYRTEC) 10 MG tablet Take 10 mg by mouth daily.   cyanocobalamin (VITAMIN B12) 1000 MCG/ML injection INJECT 1 ML OF VIT B12 INTO SKIN ONCE A WEEK X 4 WEEKS, THEN ONCE A MONTH X 3 MONTHS.   diphenhydrAMINE (BENADRYL) 25 mg capsule Take 25 mg by mouth every 6 (six) hours as needed.   ibuprofen (ADVIL,MOTRIN) 200 MG tablet Take 800 mg by mouth every 6 (six) hours as needed for pain (pain). Reported on 02/15/2015   ipratropium (ATROVENT) 0.03 % nasal spray Place into the nose.   loratadine (CLARITIN) 10 MG tablet Take 10 mg by mouth as needed for allergies.   sertraline (ZOLOFT) 25 MG tablet Take 1 tablet (25 mg total) by mouth daily.   SYRINGE-NEEDLE, DISP, 3 ML (B-D 3CC LUER-LOK SYR 25GX1") 25G X 1" 3 ML MISC Use to inject Vit B12.     Allergies:   Patient has no known allergies.   Social History   Socioeconomic History   Marital status: Married    Spouse name: Not on file   Number of children: Not on file   Years of  education: Not on file   Highest education level: Not on file  Occupational History   Not on file  Tobacco Use   Smoking status: Never   Smokeless tobacco: Never  Vaping Use   Vaping Use: Never used  Substance and Sexual Activity   Alcohol use: Yes    Comment: socially   Drug use: No   Sexual activity: Yes  Other Topics Concern   Not on file  Social History Narrative   Works at [employer]   Social Determinants of Health   Financial Resource Strain: Not on file  Food Insecurity: Not on file  Transportation Needs: Not on file  Physical Activity: Not on file  Stress: Not on file  Social Connections: Not on file     Family History: The patient's family history includes Liver disease in his father; Other in his mother;  Prostate cancer in his maternal grandfather. There is no history of Allergic rhinitis, Angioedema, Asthma, Eczema, Immunodeficiency, Urticaria, or Colon cancer.  ROS:   Please see the history of present illness.     All other systems reviewed and are negative.  EKGs/Labs/Other Studies Reviewed:    The following studies were reviewed today:   EKG:   12/08/2021: Normal sinus rhythm, rate 74, nonspecific T wave flattening, Qtc 421  Recent Labs: 12/07/2021: ALT 19; BUN 12; Creatinine, Ser 1.40; Hemoglobin 14.4; Platelets 372.0; Potassium 4.1; Sodium 140; TSH 2.60  Recent Lipid Panel    Component Value Date/Time   CHOL 142 10/17/2021 1500   TRIG 120.0 10/17/2021 1500   HDL 37.50 (L) 10/17/2021 1500   CHOLHDL 4 10/17/2021 1500   VLDL 24.0 10/17/2021 1500   LDLCALC 81 10/17/2021 1500   LDLCALC 112 (H) 12/29/2019 0853    Physical Exam:    VS:  BP (!) 155/108 (Patient Position: Standing) Comment (Patient Position): 3 mins  Pulse 96   Ht 6' (1.829 m)   Wt 289 lb (131.1 kg)   SpO2 98%   BMI 39.20 kg/m     Wt Readings from Last 3 Encounters:  12/08/21 289 lb (131.1 kg)  12/07/21 288 lb 6.1 oz (130.8 kg)  10/17/21 296 lb 4 oz (134.4 kg)     GEN:  Well nourished, well developed in no acute distress HEENT: Normal NECK: No JVD; No carotid bruits LYMPHATICS: No lymphadenopathy CARDIAC: RRR, no murmurs, rubs, gallops RESPIRATORY:  Clear to auscultation without rales, wheezing or rhonchi  ABDOMEN: Soft, non-tender, non-distended MUSCULOSKELETAL:  No edema; No deformity  SKIN: Warm and dry NEUROLOGIC:  Alert and oriented x 3 PSYCHIATRIC:  Normal affect   ASSESSMENT:    1. Syncope, unspecified syncope type   2. Precordial pain     PLAN:     Syncope: Description suggests vasovagal syncope, as describes prodromal symptoms prior to syncopal episode.  Recommend staying well-hydrated and if having prodromal symptoms, to lie on the ground until symptoms pass.  Orthostatics in  clinic today unremarkable recommend echocardiogram to rule obstructive heart disease.  Recommend Zio patch x2 weeks to rule out arrhythmia  Chest pain: Previously reported chest pain and coronary CTA was planned but he did not have done.  Reports chest pain resolved, will hold off on coronary CTA at this time.  RTC in 3 months   Medication Adjustments/Labs and Tests Ordered: Current medicines are reviewed at length with the patient today.  Concerns regarding medicines are outlined above.  Orders Placed This Encounter  Procedures   LONG TERM MONITOR (3-14 DAYS)  EKG 12-Lead   ECHOCARDIOGRAM COMPLETE   No orders of the defined types were placed in this encounter.   Patient Instructions  Medication Instructions:  Your physician recommends that you continue on your current medications as directed. Please refer to the Current Medication list given to you today.  *If you need a refill on your cardiac medications before your next appointment, please call your pharmacy*  Testing/Procedures: Your physician has requested that you have an echocardiogram. Echocardiography is a painless test that uses sound waves to create images of your heart. It provides your doctor with information about the size and shape of your heart and how well your heart's chambers and valves are working. This procedure takes approximately one hour. There are no restrictions for this procedure. Please do NOT wear cologne, perfume, aftershave, or lotions (deodorant is allowed). Please arrive 15 minutes prior to your appointment time.  ZIO XT- Long Term Monitor Instructions   Your physician has requested you wear a ZIO patch monitor for _14_ days.  This is a single patch monitor.   [employer] supplies one patch monitor per enrollment. Additional stickers are not available. Please do not apply patch if you will be having a Nuclear Stress Test, Echocardiogram, Cardiac CT, MRI, or Chest Xray during the period you would be  wearing the monitor. The patch cannot be worn during these tests. You cannot remove and re-apply the ZIO XT patch monitor.  Your ZIO patch monitor will be sent Fed Ex from [employer] directly to your home address. It may take 3-5 days to receive your monitor after you have been enrolled.  Once you have received your monitor, please review the enclosed instructions. Your monitor has already been registered assigning a specific monitor serial # to you.  Billing and Patient Assistance Program Information   We have supplied [employer] with any of your insurance information on file for billing purposes. [employer] offers a sliding scale Patient Assistance Program for patients that do not have insurance, or whose insurance does not completely cover the cost of the ZIO monitor.   You must apply for the Patient Assistance Program to qualify for this discounted rate.     To apply, please call [employer] at [phone], select option 4, then select option 2, and ask to apply for Patient Assistance Program.  Meredeth Ide will ask your household income, and how many people are in your household.  They will quote your out-of-pocket cost based on that information.  [employer] will also be able to set up a 72-month, interest-free payment plan if needed.  Applying the monitor   Shave hair from upper left chest.  Hold abrader disc by orange tab. Rub abrader in 40 strokes over the upper left chest as indicated in your monitor instructions.  Clean area with 4 enclosed alcohol pads. Let dry.  Apply patch as indicated in monitor instructions. Patch will be placed under collarbone on left side of chest with arrow pointing upward.  Rub patch adhesive wings for 2 minutes. Remove white label marked "1". Remove the white label marked "2". Rub patch adhesive wings for 2 additional minutes.  While looking in a mirror, press and release button in center of patch. A small green light will flash 3-4 times. This will be your only  indicator that the monitor has been turned on. ?  Do not shower for the first 24 hours. You may shower after the first 24 hours.  Press the button if you feel a symptom. You will hear a  small click. Record Date, Time and Symptom in the Patient Logbook.  When you are ready to remove the patch, follow instructions on the last 2 pages of the Patient Logbook. Stick patch monitor onto the last page of Patient Logbook.  Place Patient Logbook in the blue and white box.  Use locking tab on box and tape box closed securely.  The blue and white box has prepaid postage on it. Please place it in the mailbox as soon as possible. Your physician should have your test results approximately 7 days after the monitor has been mailed back to [facility].  Call [facility] Customer Care at [phone] if you have questions regarding your ZIO XT patch monitor. Call them immediately if you see an orange light blinking on your monitor.  If your monitor falls off in less than 4 days, contact our Monitor department at [phone]. ?If your monitor becomes loose or falls off after 4 days call [employer] at [phone] for suggestions on securing your monitor.?  Follow-Up: At [facility], you and your health needs are our priority.  As part of our continuing mission to provide you with exceptional heart care, we have created designated Provider Care Teams.  These Care Teams include your primary Cardiologist (physician) and Advanced Practice Providers (APPs -  Physician Assistants and Nurse Practitioners) who all work together to provide you with the care you need, when you need it.  We recommend signing up for the patient portal called "MyChart".  Sign up information is provided on this After Visit Summary.  MyChart is used to connect with patients for Virtual Visits (Telemedicine).  Patients are able to view lab/test results, encounter notes, upcoming appointments, etc.  Non-urgent messages can be sent to your  provider as well.   To learn more about what you can do with MyChart, go to [email or web address].    Your next appointment:   3 month(s)  The format for your next appointment:   In Person  Provider:   Dr. Bjorn Pippin       Signed, Little Ishikawa, MD  12/08/2021 4:26 PM    [facility]

## 2021-12-12 DIAGNOSIS — R55 Syncope and collapse: Secondary | ICD-10-CM

## 2021-12-15 ENCOUNTER — Encounter: Payer: Self-pay | Admitting: Cardiology

## 2021-12-23 ENCOUNTER — Ambulatory Visit (HOSPITAL_COMMUNITY): Payer: 59 | Attending: Cardiology

## 2021-12-23 DIAGNOSIS — R55 Syncope and collapse: Secondary | ICD-10-CM | POA: Insufficient documentation

## 2021-12-23 LAB — ECHOCARDIOGRAM COMPLETE
Area-P 1/2: 3.28 cm2
S' Lateral: 3.1 cm

## 2021-12-31 ENCOUNTER — Encounter: Payer: Self-pay | Admitting: Physician Assistant

## 2022-01-03 ENCOUNTER — Other Ambulatory Visit: Payer: Self-pay | Admitting: Physician Assistant

## 2022-01-03 MED ORDER — SERTRALINE HCL 50 MG PO TABS: 50.0000 mg | ORAL_TABLET | Freq: Every day | ORAL | 0 refills | Status: DC

## 2022-03-09 ENCOUNTER — Ambulatory Visit: Payer: 59 | Attending: Cardiology | Admitting: Cardiology

## 2022-03-09 NOTE — Progress Notes (Deleted)
Cardiology Office Note:    Date:  03/09/2022   ID:  Andrew Freeman, DOB June 15, 1980, MRN [id number]  PCP:  Inda Coke, PA  Cardiologist:  None  Electrophysiologist:  None   Referring MD: Inda Coke, PA   No chief complaint on file.   History of Present Illness:    Andrew Freeman is a 42 y.o. male with a hx of asthma who presents for follow-up.  He was referred by Inda Coke, PA for evaluation of chest pain, initially seen 01/12/20.  Reports had COVID-19 infection in October 2020.  States he started having chest pain after Covid infection.   At initial clinic visit, coronary CTA and echo were ordered but neither was done.  At follow-up visit 12/08/21, reported syncopal episode.  States that he was flying to [place] and started feeling lightheaded and diaphoretic, and then felt nauseated.  He tried to stand up and walk to the front of the plane and then passed out.  BP was 70s over 30s and he was pale and diaphoretic.  He tried to stand up and passed out again.  They diverted the plane and landed in [place].  He went to [facility]  Evaluation in the ED included head CT which was unremarkable.  He reports had another passing out spell after being in a hot tub for hours and then started having same symptoms of lightheadedness and diaphoresis and then passed out.  Zio patch x 3 days on 12/22/21 showed  no significant arrhythmias.  Echocardiogram 12/23/21 showed EF 60-65%, normal RV function, no significant valvular disease.  Since last clinic visit,  he reports chest pain and dyspnea resolved.  States that he was flying to [place] recently and started feeling lightheaded and diaphoretic, and then felt nauseated.  He tried to stand up and walk to the front of the plane and then passed out.  BP was 70s over 30s and he was pale and diaphoretic.  He tried to stand up and passed out again.  They diverted the plane and landed in [place].  He went to [facility].  Evaluation in  the ED included head CT which was unremarkable.  He reports had another passing out spell after being in a hot tub for hours and then started having same symptoms of lightheadedness and diaphoresis and then passed out.  He had recently started Celexa but stopped taking.  For exercise he reports he goes for walks, denies any chest pain or dyspnea.  Does report some lower extremity edema.  Denies any palpitations.  Reports recently has noted lightheadedness when going from lying to sitting.     Past Medical History:  Diagnosis Date   Allergy    seasonal allergies   Moderate persistent asthma 02/15/2015    Past Surgical History:  Procedure Laterality Date   WISDOM TOOTH EXTRACTION  1996    Current Medications: No outpatient medications have been marked as taking for the 03/09/22 encounter (Appointment) with Donato Heinz, MD.     Allergies:   Patient has no known allergies.   Social History   Socioeconomic History   Marital status: Married    Spouse name: Not on file   Number of children: Not on file   Years of education: Not on file   Highest education level: Not on file  Occupational History   Not on file  Tobacco Use   Smoking status: Never   Smokeless tobacco: Never  Vaping Use   Vaping Use: Never  used  Substance and Sexual Activity   Alcohol use: Yes    Comment: socially   Drug use: No   Sexual activity: Yes  Other Topics Concern   Not on file  Social History Narrative   Works at [employer] Strain: Not on file  Food Insecurity: Not on file  Transportation Needs: Not on file  Physical Activity: Not on file  Stress: Not on file  Social Connections: Not on file     Family History: The patient's family history includes Liver disease in his father; Other in his mother; Prostate cancer in his maternal grandfather. There is no history of Allergic rhinitis, Angioedema, Asthma, Eczema, Immunodeficiency,  Urticaria, or Colon cancer.  ROS:   Please see the history of present illness.     All other systems reviewed and are negative.  EKGs/Labs/Other Studies Reviewed:    The following studies were reviewed today:   EKG:   12/08/2021: Normal sinus rhythm, rate 74, nonspecific T wave flattening, Qtc 421  Recent Labs: 12/07/2021: ALT 19; BUN 12; Creatinine, Ser 1.40; Hemoglobin 14.4; Platelets 372.0; Potassium 4.1; Sodium 140; TSH 2.60  Recent Lipid Panel    Component Value Date/Time   CHOL 142 10/17/2021 1500   TRIG 120.0 10/17/2021 1500   HDL 37.50 (L) 10/17/2021 1500   CHOLHDL 4 10/17/2021 1500   VLDL 24.0 10/17/2021 1500   LDLCALC 81 10/17/2021 1500   LDLCALC 112 (H) 12/29/2019 0853    Physical Exam:    VS:  There were no vitals taken for this visit.    Wt Readings from Last 3 Encounters:  12/08/21 289 lb (131.1 kg)  12/07/21 288 lb 6.1 oz (130.8 kg)  10/17/21 296 lb 4 oz (134.4 kg)     GEN:  Well nourished, well developed in no acute distress HEENT: Normal NECK: No JVD; No carotid bruits LYMPHATICS: No lymphadenopathy CARDIAC: RRR, no murmurs, rubs, gallops RESPIRATORY:  Clear to auscultation without rales, wheezing or rhonchi  ABDOMEN: Soft, non-tender, non-distended MUSCULOSKELETAL:  No edema; No deformity  SKIN: Warm and dry NEUROLOGIC:  Alert and oriented x 3 PSYCHIATRIC:  Normal affect   ASSESSMENT:    No diagnosis found.   PLAN:     Syncope: Description suggests vasovagal syncope, as describes prodromal symptoms prior to syncopal episode.  Recommend staying well-hydrated and if having prodromal symptoms, to lie on the ground until symptoms pass.  Orthostatics in clinic 11/3-/23 unremarkable.  Zio patch x 3 days on 12/22/21 showed  no significant arrhythmias.  Echocardiogram 12/23/21 showed EF 60-65%, normal RV function, no significant valvular disease.  Chest pain: Previously reported chest pain at initial clinic visit 01/12/20 and coronary CTA was  planned but he did not have done.  Reports chest pain resolved, will hold off on coronary CTA at this time.  RTC in ***   Medication Adjustments/Labs and Tests Ordered: Current medicines are reviewed at length with the patient today.  Concerns regarding medicines are outlined above.  No orders of the defined types were placed in this encounter.  No orders of the defined types were placed in this encounter.   There are no Patient Instructions on file for this visit.   Signed, Donato Heinz, MD  03/09/2022 11:46 AM    [place]

## 2022-04-04 ENCOUNTER — Other Ambulatory Visit: Payer: Self-pay | Admitting: Physician Assistant

## 2022-04-18 NOTE — Progress Notes (Signed)
Andrew Freeman is a 42 y.o. male here for medication counseling.  History of Present Illness:   No chief complaint on file.   HPI  Depression Treated with bupropion 300 mg daily, sertraline 50 mg daily.    Past Medical History:  Diagnosis Date   Allergy    seasonal allergies   Moderate persistent asthma 02/15/2015     Social History   Tobacco Use   Smoking status: Never   Smokeless tobacco: Never  Vaping Use   Vaping Use: Never used  Substance Use Topics   Alcohol use: Yes    Comment: socially   Drug use: No    Past Surgical History:  Procedure Laterality Date   WISDOM TOOTH EXTRACTION  1996    Family History  Problem Relation Age of Onset   Other Mother        significant GI history   Liver disease Father    Prostate cancer Maternal Grandfather    Allergic rhinitis Neg Hx    Angioedema Neg Hx    Asthma Neg Hx    Eczema Neg Hx    Immunodeficiency Neg Hx    Urticaria Neg Hx    Colon cancer Neg Hx     No Known Allergies  Current Medications:   Current Outpatient Medications:    albuterol (PROAIR HFA) 108 (90 Base) MCG/ACT inhaler, Inhale 2 puffs into the lungs every 4 (four) hours as needed for wheezing or shortness of breath., Disp: 1 Inhaler, Rfl: 1   buPROPion (WELLBUTRIN XL) 300 MG 24 hr tablet, Take 1 tablet (300 mg total) by mouth daily., Disp: 90 tablet, Rfl: 1   cetirizine (ZYRTEC) 10 MG tablet, Take 10 mg by mouth daily., Disp: , Rfl:    cyanocobalamin (VITAMIN B12) 1000 MCG/ML injection, INJECT 1 ML OF VIT B12 INTO SKIN ONCE A WEEK X 4 WEEKS, THEN ONCE A MONTH X 3 MONTHS., Disp: 12 mL, Rfl: 1   diphenhydrAMINE (BENADRYL) 25 mg capsule, Take 25 mg by mouth every 6 (six) hours as needed., Disp: , Rfl:    ibuprofen (ADVIL,MOTRIN) 200 MG tablet, Take 800 mg by mouth every 6 (six) hours as needed for pain (pain). Reported on 02/15/2015, Disp: , Rfl:    ipratropium (ATROVENT) 0.03 % nasal spray, Place into the nose., Disp: , Rfl:    loratadine  (CLARITIN) 10 MG tablet, Take 10 mg by mouth as needed for allergies., Disp: , Rfl:    sertraline (ZOLOFT) 50 MG tablet, TAKE 1 TABLET(50 MG) BY MOUTH DAILY, Disp: 90 tablet, Rfl: 0   SYRINGE-NEEDLE, DISP, 3 ML (B-D 3CC LUER-LOK SYR 25GX1") 25G X 1" 3 ML MISC, Use to inject Vit B12., Disp: 7 each, Rfl: 0   Review of Systems:   ROS  Vitals:   There were no vitals filed for this visit.   There is no height or weight on file to calculate BMI.  Physical Exam:   Physical Exam  Assessment and Plan:   ***   I,Alexander Ruley,acting as a scribe for Jarold Motto, PA.,have documented all relevant documentation on the behalf of Jarold Motto, PA,as directed by  Jarold Motto, PA while in the presence of Jarold Motto, [place].   ***   Jarold Motto, PA-C

## 2022-04-19 ENCOUNTER — Encounter: Payer: Self-pay | Admitting: Physician Assistant

## 2022-04-19 ENCOUNTER — Ambulatory Visit (INDEPENDENT_AMBULATORY_CARE_PROVIDER_SITE_OTHER): Payer: 59 | Admitting: Physician Assistant

## 2022-04-19 VITALS — BP 130/90 | HR 97 | Temp 97.7°F | Ht 72.0 in | Wt 279.4 lb

## 2022-04-19 DIAGNOSIS — F32A Depression, unspecified: Secondary | ICD-10-CM | POA: Diagnosis not present

## 2022-04-19 DIAGNOSIS — F419 Anxiety disorder, unspecified: Secondary | ICD-10-CM | POA: Diagnosis not present

## 2022-04-19 DIAGNOSIS — Z79899 Other long term (current) drug therapy: Secondary | ICD-10-CM | POA: Diagnosis not present

## 2022-04-19 MED ORDER — CITALOPRAM HYDROBROMIDE 20 MG PO TABS: ORAL_TABLET | ORAL | 3 refills | Status: DC

## 2022-04-19 NOTE — Patient Instructions (Addendum)
It was great to see you!  Continue Wellbutrin 300 mg daily Stop zoloft 50 mg daily Start celexa 10 mg daily (1/2 tablet)  Follow-up in 1-2 weeks to recheck EKG and consider increase at that time   Take care,  Jarold Motto PA-C

## 2022-04-20 ENCOUNTER — Encounter: Payer: Self-pay | Admitting: Physician Assistant

## 2022-04-20 DIAGNOSIS — F419 Anxiety disorder, unspecified: Secondary | ICD-10-CM

## 2022-04-20 DIAGNOSIS — F32A Depression, unspecified: Secondary | ICD-10-CM

## 2022-04-21 ENCOUNTER — Encounter: Payer: Self-pay | Admitting: Physician Assistant

## 2022-04-21 ENCOUNTER — Other Ambulatory Visit: Payer: Self-pay | Admitting: Physician Assistant

## 2022-04-21 NOTE — Telephone Encounter (Signed)
Please see message. °

## 2022-05-03 ENCOUNTER — Ambulatory Visit: Payer: 59

## 2022-06-01 ENCOUNTER — Telehealth (HOSPITAL_COMMUNITY): Payer: Self-pay

## 2022-06-01 NOTE — Telephone Encounter (Signed)
[employer] coordinator received referral and attempted to contact patient. Coordinator left vm requesting the patient to call back to discuss TMS treatment and eligibility.

## 2022-06-02 ENCOUNTER — Telehealth (HOSPITAL_COMMUNITY): Payer: Self-pay

## 2022-06-02 NOTE — Telephone Encounter (Signed)
[employer] Coordinator attempted to call pt's mobile number and was routed to voicemail. Coordinator did not leave a second message and will wait for a return call back to discuss TMS eligibility or psychotherapy.

## 2022-07-11 ENCOUNTER — Other Ambulatory Visit: Payer: Self-pay | Admitting: Physician Assistant

## 2023-01-22 ENCOUNTER — Encounter: Payer: 59 | Admitting: Physician Assistant

## 2023-01-29 ENCOUNTER — Encounter: Payer: 59 | Admitting: Physician Assistant

## 2023-01-31 ENCOUNTER — Encounter: Payer: 59 | Admitting: Physician Assistant

## 2023-02-21 ENCOUNTER — Ambulatory Visit (INDEPENDENT_AMBULATORY_CARE_PROVIDER_SITE_OTHER): Payer: 59 | Admitting: Physician Assistant

## 2023-02-21 ENCOUNTER — Encounter: Payer: Self-pay | Admitting: Physician Assistant

## 2023-02-21 VITALS — BP 130/88 | HR 78 | Temp 98.1°F | Ht 72.0 in | Wt 278.2 lb

## 2023-02-21 DIAGNOSIS — Z8042 Family history of malignant neoplasm of prostate: Secondary | ICD-10-CM | POA: Diagnosis not present

## 2023-02-21 DIAGNOSIS — F32A Depression, unspecified: Secondary | ICD-10-CM

## 2023-02-21 DIAGNOSIS — E669 Obesity, unspecified: Secondary | ICD-10-CM

## 2023-02-21 DIAGNOSIS — Z0000 Encounter for general adult medical examination without abnormal findings: Secondary | ICD-10-CM

## 2023-02-21 DIAGNOSIS — E538 Deficiency of other specified B group vitamins: Secondary | ICD-10-CM | POA: Diagnosis not present

## 2023-02-21 MED ORDER — WEGOVY 0.25 MG/0.5ML ~~LOC~~ SOAJ: 0.2500 mg | SUBCUTANEOUS | 1 refills | Status: DC

## 2023-02-21 NOTE — Progress Notes (Signed)
Subjective:    Andrew Freeman is a 43 y.o. male and is here for a comprehensive physical exam.  HPI  Health Maintenance Due  Topic Date Due   Pneumococcal Vaccine 9-46 Years old (1 of 2 - PCV) Never done    Acute Concerns: Weight Loss Does report following a healthier diet but continues to struggle to lose weight.  Interested in medical interventions at this time. Does report occasional swelling in legs that results after sitting for long periods.   Chronic Issues: Anxiety and Depression Was on Wellbutrin but did not like the way that makes him feel No longer taking B12 injection. Well controlled at this time with no concerns.  Requests a referral to an alternative psychiatry office.     Health Maintenance: Immunizations -- N/A Colonoscopy -- N/A PSA -- No results found for: "PSA1", "PSA" Diet -- typical, well balanced diet Sleep habits -- complains of difficulty falling asleep Exercise -- low exercise, mostly daily walks.  Weight --  Recent weight history Wt Readings from Last 10 Encounters:  02/21/23 278 lb 4 oz (126.2 kg)  04/19/22 279 lb 6.1 oz (126.7 kg)  12/08/21 289 lb (131.1 kg)  12/07/21 288 lb 6.1 oz (130.8 kg)  10/17/21 296 lb 4 oz (134.4 kg)  10/06/20 (!) 302 lb (137 kg)  01/12/20 294 lb (133.4 kg)  12/29/19 295 lb (133.8 kg)  08/10/18 290 lb (131.5 kg)  09/17/17 294 lb (133.4 kg)   Body mass index is 37.74 kg/m.  Mood -- stable Alcohol use --  reports current alcohol use.  Tobacco use --  Tobacco Use: Low Risk  (02/21/2023)   Patient History    Smoking Tobacco Use: Never    Smokeless Tobacco Use: Never    Passive Exposure: Not on file    Eligible for Low Dose CT? no  UTD with eye doctor? yes UTD with dentist? yes     04/19/2022    1:38 PM  Depression screen PHQ 2/9  Decreased Interest 2  Down, Depressed, Hopeless 2  PHQ - 2 Score 4  Altered sleeping 3  Tired, decreased energy 3  Change in appetite 0  Feeling bad or failure  about yourself  0  Trouble concentrating 2  Moving slowly or fidgety/restless 0  Suicidal thoughts 0  PHQ-9 Score 12  Difficult doing work/chores Very difficult    Other providers/specialists: Patient Care Team: Jarold Motto, [place] as PCP - General (Physician Assistant)    PMHx, SurgHx, SocialHx, Medications, and Allergies were reviewed in the Visit Navigator and updated as appropriate.   Past Medical History:  Diagnosis Date   Allergy    seasonal allergies   Moderate persistent asthma 02/15/2015     Past Surgical History:  Procedure Laterality Date   WISDOM TOOTH EXTRACTION  1996     Family History  Problem Relation Age of Onset   Other Mother        significant GI history   Liver disease Father    COPD Father    Prostate cancer Maternal Grandfather    Allergic rhinitis Neg Hx    Angioedema Neg Hx    Asthma Neg Hx    Eczema Neg Hx    Immunodeficiency Neg Hx    Urticaria Neg Hx    Colon cancer Neg Hx     Social History   Tobacco Use   Smoking status: Never   Smokeless tobacco: Never  Vaping Use   Vaping status: Never Used  Substance Use  Topics   Alcohol use: Yes    Comment: socially   Drug use: No    Review of Systems:   Review of Systems  Constitutional:  Negative for chills, fever, malaise/fatigue and weight loss.  HENT:  Negative for hearing loss, sinus pain and sore throat.   Respiratory:  Negative for cough and hemoptysis.   Cardiovascular:  Positive for leg swelling. Negative for chest pain, palpitations and PND.  Gastrointestinal:  Negative for abdominal pain, constipation, diarrhea, heartburn, nausea and vomiting.  Genitourinary:  Negative for dysuria, frequency and urgency.  Musculoskeletal:  Positive for back pain. Negative for myalgias and neck pain.  Skin:  Negative for itching and rash.  Neurological:  Negative for dizziness, tingling, seizures and headaches.  Endo/Heme/Allergies:  Negative for polydipsia.  Psychiatric/Behavioral:   Negative for depression. The patient is not nervous/anxious.     Objective:    Vitals:   02/21/23 1352  BP: 130/88  Pulse: 78  Temp: 98.1 F (36.7 C)  SpO2: 98%    Body mass index is 37.74 kg/m.  General  Alert, cooperative, no distress, appears stated age  Head:  Normocephalic, without obvious abnormality, atraumatic  Eyes:  PERRL, conjunctiva/corneas clear, EOM's intact, fundi benign, both eyes       Ears:  Normal TM's and external ear canals, both ears  Nose: Nares normal, septum midline, mucosa normal, no drainage or sinus tenderness  Throat: Lips, mucosa, and tongue normal; teeth and gums normal  Neck: Supple, symmetrical, trachea midline, no adenopathy;     thyroid:  No enlargement/tenderness/nodules; no carotid bruit or JVD  Back:   Symmetric, no curvature, ROM normal, no CVA tenderness  Lungs:   Clear to auscultation bilaterally, respirations unlabored  Chest wall:  No tenderness or deformity  Heart:  Regular rate and rhythm, S1 and S2 normal, no murmur, rub or gallop  Abdomen:   Soft, non-tender, bowel sounds active all four quadrants, no masses, no organomegaly  Extremities: Extremities normal, atraumatic, no cyanosis or edema  Prostate : Deferred   Skin: Skin color, texture, turgor normal, no rashes or lesions  Lymph nodes: Cervical, supraclavicular, and axillary nodes normal  Neurologic: CNII-XII grossly intact. Normal strength, sensation and reflexes throughout   AssessmentPlan:   Routine physical examination Today patient counseled on age appropriate routine health concerns for screening and prevention, each reviewed and up to date or declined. Immunizations reviewed and up to date or declined. Labs ordered and reviewed. Risk factors for depression reviewed and negative. Hearing function and visual acuity are intact. ADLs screened and addressed as needed. Functional ability and level of safety reviewed and appropriate. Education, counseling and referrals  performed based on assessed risks today. Patient provided with a copy of personalized plan for preventive services.   Obesity, unspecified class, unspecified obesity type, unspecified whether serious comorbidity present Will trial Wegovy 0.25 mg weekly -- risks and benefits/side effect(s) discussed Follow-up in 3 month(s) if starting and covered with insurance Continue efforts at healthy lifestyle  B12 deficiency Update B12 and provide recommendations  -- he is agreeable to restarting injections if warranted  Depression, unspecified depression type Uncontrolled but declines suicidal ideation/hi Would like referral to psychiatry I discussed with patient that if they develop any SI, to tell someone immediately and seek medical attention.  Family history of prostate cancer Update PSA     Jarold Motto, PA-C [place] [facility] M Kadhim,acting as a scribe for [employer], PA.,have documented all relevant documentation on the behalf  of Jarold Motto, PA,as directed by  Jarold Motto, PA while in the presence of Jarold Motto, [place].   I, Jarold Motto, [place], have reviewed all documentation for this visit. The documentation on 02/21/23 for the exam, diagnosis, procedures, and orders are all accurate and complete.

## 2023-02-22 ENCOUNTER — Other Ambulatory Visit (HOSPITAL_COMMUNITY): Payer: Self-pay

## 2023-02-22 ENCOUNTER — Telehealth: Payer: Self-pay

## 2023-02-22 LAB — CBC WITH DIFFERENTIAL/PLATELET
Basophils Absolute: 0.1 10*3/uL (ref 0.0–0.1)
Basophils Relative: 0.9 % (ref 0.0–3.0)
Eosinophils Absolute: 0.3 10*3/uL (ref 0.0–0.7)
Eosinophils Relative: 3.9 % (ref 0.0–5.0)
HCT: 41.3 % (ref 39.0–52.0)
Hemoglobin: 14.1 g/dL (ref 13.0–17.0)
Lymphocytes Relative: 28.3 % (ref 12.0–46.0)
Lymphs Abs: 2.4 10*3/uL (ref 0.7–4.0)
MCHC: 34.1 g/dL (ref 30.0–36.0)
MCV: 87.6 fL (ref 78.0–100.0)
Monocytes Absolute: 0.8 10*3/uL (ref 0.1–1.0)
Monocytes Relative: 9.6 % (ref 3.0–12.0)
Neutro Abs: 4.8 10*3/uL (ref 1.4–7.7)
Neutrophils Relative %: 57.3 % (ref 43.0–77.0)
Platelets: 309 10*3/uL (ref 150.0–400.0)
RBC: 4.71 Mil/uL (ref 4.22–5.81)
RDW: 14.6 % (ref 11.5–15.5)
WBC: 8.4 10*3/uL (ref 4.0–10.5)

## 2023-02-22 LAB — COMPREHENSIVE METABOLIC PANEL
ALT: 21 U/L (ref 0–53)
AST: 15 U/L (ref 0–37)
Albumin: 4.4 g/dL (ref 3.5–5.2)
Alkaline Phosphatase: 62 U/L (ref 39–117)
BUN: 14 mg/dL (ref 6–23)
CO2: 29 meq/L (ref 19–32)
Calcium: 9.4 mg/dL (ref 8.4–10.5)
Chloride: 105 meq/L (ref 96–112)
Creatinine, Ser: 1.21 mg/dL (ref 0.40–1.50)
GFR: 74.61 mL/min (ref >60.00)
Glucose, Bld: 89 mg/dL (ref 70–99)
Potassium: 4.7 meq/L (ref 3.5–5.1)
Sodium: 142 meq/L (ref 135–145)
Total Bilirubin: 0.4 mg/dL (ref 0.2–1.2)
Total Protein: 7.1 g/dL (ref 6.0–8.3)

## 2023-02-22 LAB — PSA: PSA: 0.56 ng/mL (ref 0.10–4.00)

## 2023-02-22 LAB — LIPID PANEL
Cholesterol: 173 mg/dL (ref 0–200)
HDL: 41.6 mg/dL (ref >39.00)
LDL Cholesterol: 102 mg/dL — ABNORMAL HIGH (ref 0–99)
NonHDL: 131.08
Total CHOL/HDL Ratio: 4
Triglycerides: 144 mg/dL (ref 0.0–149.0)
VLDL: 28.8 mg/dL (ref 0.0–40.0)

## 2023-02-22 LAB — VITAMIN B12: Vitamin B-12: 185 pg/mL — ABNORMAL LOW (ref 211–911)

## 2023-02-22 NOTE — Telephone Encounter (Addendum)
Pharmacy Patient Advocate Encounter   Received notification from Physicians office that prior authorization for Wegovy 0.25MG /0.5ML auto-injectors is required/requested.   Insurance verification completed.   The patient is insured through [facility] .   Per test claim: The patients plan doesn't cover weight loss drugs.The plan only covers C.V Indications. (These CV indications are PAD,MI, And/or Stroke related.) Please advise, as this diagnosis is not listed on the patients chart.

## 2023-02-23 ENCOUNTER — Encounter: Payer: Self-pay | Admitting: Physician Assistant

## 2023-02-23 DIAGNOSIS — E669 Obesity, unspecified: Secondary | ICD-10-CM | POA: Insufficient documentation

## 2023-02-23 DIAGNOSIS — Z8042 Family history of malignant neoplasm of prostate: Secondary | ICD-10-CM | POA: Insufficient documentation

## 2023-02-23 NOTE — Telephone Encounter (Signed)
Tried to contact pt, unable to leave message voicemail box is full.

## 2023-02-23 NOTE — Telephone Encounter (Signed)
Left message on voicemail to call office. If patient calls back tell him Reginal Lutes was denied.

## 2023-02-26 NOTE — Telephone Encounter (Signed)
Spoke to pt's wife Morrie Sheldon, told her I have been trying to get in touch with him. Please tell him Reginal Lutes was denied, insurance does not cover weight loss medications unless you have had and MI or Stroke. Morrie Sheldon verbalized understanding and will let him know.

## 2023-03-01 ENCOUNTER — Encounter: Payer: Self-pay | Admitting: Physician Assistant

## 2023-03-02 ENCOUNTER — Other Ambulatory Visit: Payer: Self-pay | Admitting: Physician Assistant

## 2023-03-02 MED ORDER — VALACYCLOVIR HCL 1 G PO TABS: ORAL_TABLET | ORAL | 1 refills | Status: DC

## 2023-03-14 ENCOUNTER — Other Ambulatory Visit (HOSPITAL_COMMUNITY): Payer: Self-pay

## 2023-04-09 ENCOUNTER — Ambulatory Visit (HOSPITAL_COMMUNITY): Payer: Self-pay | Admitting: Family

## 2023-04-30 ENCOUNTER — Ambulatory Visit (HOSPITAL_BASED_OUTPATIENT_CLINIC_OR_DEPARTMENT_OTHER): Payer: Self-pay | Admitting: Family

## 2023-04-30 VITALS — BP 130/91 | HR 69 | Wt 278.0 lb

## 2023-04-30 DIAGNOSIS — F320 Major depressive disorder, single episode, mild: Secondary | ICD-10-CM | POA: Diagnosis not present

## 2023-04-30 DIAGNOSIS — F411 Generalized anxiety disorder: Secondary | ICD-10-CM | POA: Diagnosis not present

## 2023-04-30 MED ORDER — HYDROXYZINE HCL 10 MG PO TABS: 10.0000 mg | ORAL_TABLET | Freq: Every evening | ORAL | 0 refills | Status: AC | PRN

## 2023-04-30 MED ORDER — BUPROPION HCL ER (XL) 150 MG PO TB24: 150.0000 mg | ORAL_TABLET | ORAL | 2 refills | Status: AC

## 2023-04-30 NOTE — Progress Notes (Addendum)
 Psychiatric Initial Adult Assessment   Patient Identification: Andrew Freeman MRN:  [id number] Date of Evaluation:  04/30/2023 Referral Source: Alexander Iba, [place] Chief Complaint: Increased anxiety and depression symptoms  Visit Diagnosis:    ICD-10-CM   1. Current mild episode of major depressive disorder, unspecified whether recurrent (HCC)  F32.0     2. GAD (generalized anxiety disorder)  F41.1       History of Present Illness:  Andrew Freeman 43 year old male presents to establish care.  Reports he was referred by his primary care provider due to depression and anxiety symptoms.  States he has tried multiple SSRIs in the past.  States he recently discontinued medications due to syncopal episodes to which he attributes to Celexa .  States he has tried Prozac in the past but has experience,  sexual dysfunction with medication.  Reported Zoloft  caused tiredness.  Reports he has tried Wellbutrin , however has since discontinued it.  Andrew Freeman reported his symptoms include increased depression, anxiety, sleep disturbance,fatigue and decreased motivation.  States that symptoms have been going on for roughly a year.  Denies family history related to mental illness.  Andrew Freeman denies previous inpatient admissions.  Denies auditory visual hallucinations.  Denies previous history of suicidal attempts and/or self-injurious behaviors.  Does report using marijuana roughly 1 month ago, at this time he states marijuana used occasionally.  Occasional alcohol use last drink was 43 years old.  Denies illegal or criminal charges.  Andrew Freeman report has been married for the past 15 years.  States his wife is supportive.  States they have 5 children.  Reports he is currently employed by the [employer].  Stated he was employed by [employer] in [facility] [employer] department in the past which may attribute to some posttraumatic stress symptoms.  Denied ongoing ruminations or symptoms of worry.   Major depressive  disorder: Generalized anxiety disorder:  Initiated Wellbutrin  bupropion  150 mg daily Initiated hydroxyzine  10 to 20 mg at night for sleep disturbance -Follow-up 2 weeks medication management   Denies any medical conditions to include seizure disorder, migraines or thyroid disorder PHQ-9 12 GAD-7 14.  Denies chronic pain.  Andrew Freeman is sitting, pleasant, cooperative); He is alert/oriented x 4; calm/cooperative; and mood congruent with affect.  Patient is speaking in a clear tone at moderate volume, and normal pace; with good eye contact.    His thought process is coherent and relevant; There is no indication that he is currently responding to internal/external stimuli or experiencing delusional thought content.  Patient denies suicidal/self-harm/homicidal ideation, psychosis, and paranoia.  Patient has remained calm throughout assessment and has answered questions appropriately.    Associated Signs/Symptoms: Depression Symptoms:  depressed mood, difficulty concentrating, anxiety, disturbed sleep, (Hypo) Manic Symptoms:  Distractibility, Anxiety Symptoms:  Excessive Worry, Psychotic Symptoms:  Hallucinations: None PTSD Symptoms: Paramedics- EMS  Past Psychiatric History:   Previous Psychotropic Medications: No   Substance Abuse History in the last 12 months:  Yes.   -thc- I month ago  Consequences of Substance Abuse: NA  Past Medical History:  Past Medical History:  Diagnosis Date   Allergy    seasonal allergies   Moderate persistent asthma 02/15/2015    Past Surgical History:  Procedure Laterality Date   WISDOM TOOTH EXTRACTION  1996    Family Psychiatric History: Denied   Family History:  Family History  Problem Relation Age of Onset   Other Mother        significant GI history   Liver disease Father  COPD Father    Prostate cancer Maternal Grandfather    Allergic rhinitis Neg Hx    Angioedema Neg Hx    Asthma Neg Hx    Eczema Neg Hx     Immunodeficiency Neg Hx    Urticaria Neg Hx    Colon cancer Neg Hx     Social History:   Social History   Socioeconomic History   Marital status: Married    Spouse name: Not on file   Number of children: Not on file   Years of education: Not on file   Highest education level: Not on file  Occupational History   Not on file  Tobacco Use   Smoking status: Never   Smokeless tobacco: Never  Vaping Use   Vaping status: Never Used  Substance and Sexual Activity   Alcohol use: Yes    Comment: socially   Drug use: No   Sexual activity: Yes  Other Topics Concern   Not on file  Social History Narrative   Works at [employer]   Social Drivers of Health   Financial Resource Strain: Not on file  Food Insecurity: Not on file  Transportation Needs: Not on file  Physical Activity: Not on file  Stress: Not on file  Social Connections: Not on file    Additional Social History:   Allergies:   Allergies  Allergen Reactions   Citalopram  Swelling and Other (See Comments)    Syncope    Metabolic Disorder Labs: Lab Results  Component Value Date   HGBA1C 5.4 07/04/2016   No results found for: "PROLACTIN" Lab Results  Component Value Date   CHOL 173 02/21/2023   TRIG 144.0 02/21/2023   HDL 41.60 02/21/2023   CHOLHDL 4 02/21/2023   VLDL 28.8 02/21/2023   LDLCALC 102 (H) 02/21/2023   LDLCALC 81 10/17/2021   Lab Results  Component Value Date   TSH 2.60 12/07/2021    Therapeutic Level Labs: No results found for: "LITHIUM" No results found for: "CBMZ" No results found for: "VALPROATE"  Current Medications: Current Outpatient Medications  Medication Sig Dispense Refill   buPROPion  (WELLBUTRIN  XL) 150 MG 24 hr tablet Take 1 tablet (150 mg total) by mouth every morning. 30 tablet 2   hydrOXYzine  (ATARAX ) 10 MG tablet Take 1 tablet (10 mg total) by mouth at bedtime and may repeat dose one time if needed. 30 tablet 0   albuterol  (PROAIR  HFA) 108 (90 Base) MCG/ACT  inhaler Inhale 2 puffs into the lungs every 4 (four) hours as needed for wheezing or shortness of breath. 1 Inhaler 1   cetirizine (ZYRTEC) 10 MG tablet Take 10 mg by mouth daily.     cyclobenzaprine (FLEXERIL) 5 MG tablet Take 5 mg by mouth every 8 (eight) hours as needed.     diphenhydrAMINE (BENADRYL) 25 mg capsule Take 25 mg by mouth every 6 (six) hours as needed.     ibuprofen (ADVIL,MOTRIN) 200 MG tablet Take 800 mg by mouth every 6 (six) hours as needed for pain (pain). Reported on 02/15/2015     ipratropium (ATROVENT) 0.03 % nasal spray Place into the nose.     loratadine (CLARITIN) 10 MG tablet Take 10 mg by mouth as needed for allergies.     Semaglutide -Weight Management (WEGOVY ) 0.25 MG/0.5ML SOAJ Inject 0.25 mg into the skin once a week. 2 mL 1   SYRINGE-NEEDLE, DISP, 3 ML (B-D 3CC LUER-LOK SYR 25GX1") 25G X 1" 3 ML MISC Use to inject Vit B12. 7 each  0   valACYclovir  (VALTREX ) 1000 MG tablet Take 2 grams twice daily x 1 day for outbreaks as needed 20 tablet 1   No current facility-administered medications for this visit.    Musculoskeletal: Strength & Muscle Tone: within normal limits Gait & Station: normal Patient leans: N/A  Psychiatric Specialty Exam: Review of Systems  Psychiatric/Behavioral:  Positive for decreased concentration and sleep disturbance. The patient is nervous/anxious.   All other systems reviewed and are negative.   Blood pressure (!) 130/91, pulse 69, weight 278 lb (126.1 kg).Body mass index is 37.7 kg/m.  General Appearance: Casual  Eye Contact:  Good  Speech:  Clear and Coherent  Volume:  Normal  Mood:  Anxious and Depressed  Affect:  Congruent  Thought Process:  Coherent  Orientation:  Full (Time, Place, and Person)  Thought Content:  Logical  Suicidal Thoughts:  No  Homicidal Thoughts:  No  Memory:  Immediate;   Good Recent;   Good  Judgement:  Good  Insight:  Good  Psychomotor Activity:  Normal  Concentration:  Concentration: Good   Recall:  Good  Fund of Knowledge:Good  Language: Good  Akathisia:  No  Handed:  Right  AIMS (if indicated):  not done  Assets:  Communication Skills Desire for Improvement Resilience Social Support  ADL's:  Intact  Cognition: WNL  Sleep:  Poor   Screenings: GAD-7    Andrew Freeman Visit from 04/19/2022 in [facility] [employer] [facility] from 10/17/2021 in [facility] [employer] [employer] [facility] from 10[facility]06/2020 in [facility]  Total GAD-7 Score 11 18 19       PHQ2-9    Flowsheet Row Office Visit from 04/30/2023 in [facility] Office Visit from 04/19/2022 in [facility] [employer] Visit from 10/17/2021 in [facility] [employer] from 10/06/2020 in [facility] [employer] [employer] from 12/29/2019 in [facility] [place]  PHQ-2 Total Score 4 4 3  0 0  PHQ-9 Total Score 12 12 17  -- --       Assessment and Plan: Shafer Ritzel 43 year old Caucasian male presents to establish care.  He reports a cancer diagnosis related to depression and anxiety.  States he has tried multiple SSRIs in the past.  Reports he is interested in restarting medications at this time.  Reports low motivation, anxiety and depression symptoms.  Reports he has been prescribed bupropion  in the past however, self discontinued medications.  Will initiate hydroxyzine  10 mg p.o. nightly as needed follow-up 2 weeks for medication adherence/tolerability.  Collaboration of Care: Medication Management AEB start bupropion  150 mg daily and initiated hydroxyzine  10 mg daily as needed  Patient/Guardian was advised Release of Information must be obtained prior to any record release in order to collaborate their care with an outside provider. Patient/Guardian was advised if  they have not already done so to contact the registration department to sign all necessary forms in order for us  to release information regarding their care.   Consent: Patient/Guardian gives verbal consent for treatment and assignment of benefits for services provided during this visit. Patient/Guardian expressed understanding and agreed to proceed.   Levester Reagin, NP 4/21/20253:23 PM

## 2023-04-30 NOTE — Addendum Note (Signed)
 Addended by: Levester Reagin on: 04/30/2023 03:23 PM   Modules accepted: Level of Service

## 2023-05-14 ENCOUNTER — Encounter (HOSPITAL_COMMUNITY): Payer: Self-pay

## 2023-05-14 ENCOUNTER — Telehealth (HOSPITAL_COMMUNITY): Admitting: Family

## 2023-06-09 ENCOUNTER — Other Ambulatory Visit: Payer: Self-pay | Admitting: Physician Assistant

## 2023-10-08 ENCOUNTER — Telehealth: Payer: Self-pay

## 2023-10-08 ENCOUNTER — Other Ambulatory Visit (HOSPITAL_COMMUNITY): Payer: Self-pay

## 2023-10-08 ENCOUNTER — Ambulatory Visit: Payer: Self-pay | Admitting: Physician Assistant

## 2023-10-08 VITALS — BP 138/98 | HR 78 | Temp 97.2°F | Ht 72.0 in | Wt 270.2 lb

## 2023-10-08 DIAGNOSIS — K1370 Unspecified lesions of oral mucosa: Secondary | ICD-10-CM

## 2023-10-08 DIAGNOSIS — K529 Noninfective gastroenteritis and colitis, unspecified: Secondary | ICD-10-CM

## 2023-10-08 DIAGNOSIS — R030 Elevated blood-pressure reading, without diagnosis of hypertension: Secondary | ICD-10-CM

## 2023-10-08 DIAGNOSIS — E669 Obesity, unspecified: Secondary | ICD-10-CM | POA: Diagnosis not present

## 2023-10-08 MED ORDER — ZEPBOUND 2.5 MG/0.5ML ~~LOC~~ SOAJ
2.5000 mg | SUBCUTANEOUS | 0 refills | Status: DC
Start: 2023-10-08 — End: 2023-11-07

## 2023-10-08 NOTE — Progress Notes (Signed)
 Andrew Freeman is a 43 y.o. male here for a follow up of a pre-existing problem.  History of Present Illness:   Chief Complaint  Patient presents with   Tongue problem    Pt c/o canker sore on right side of tongue, x several days.    Discussed the use of AI scribe software for clinical note transcription with the patient, who gave verbal consent to proceed.  History of Present Illness   Andrew Freeman is a 43 year old male who presents with recurrent canker sores on the tongue.  He experiences painful canker sores on his tongue, occurring once or twice a month, often on the inside of his cheeks. He associates these with high stress levels. He has tried Valtrex  without relief.  He denies new symptoms such as extreme joint pain, rashes, or unexplained weight changes. He experiences chronic back pain and plantar fasciitis. He works as a Radiation protection practitioner and feels run down with pain and fatigue recently.  He has gastrointestinal issues, including severe stomach problems and frequent diarrhea. He has not had a colonoscopy and has no diagnosis of Crohn's disease or ulcerative colitis. Wegovy  caused significant gastrointestinal side effects. He continues to exercise regularly and has very active job. He avoid fast food and limits high carb options. He is interested in trialing Zepbound  Reports there is no chest pain, shortness of breath or leg swelling. BP is slightly elevated today.    Past Medical History:  Diagnosis Date   Allergy    seasonal allergies   Moderate persistent asthma 02/15/2015     Social History   Tobacco Use   Smoking status: Never   Smokeless tobacco: Never  Vaping Use   Vaping status: Never Used  Substance Use Topics   Alcohol use: Yes    Comment: socially   Drug use: No    Past Surgical History:  Procedure Laterality Date   WISDOM TOOTH EXTRACTION  1996    Family History  Problem Relation Age of Onset   Other Mother        significant GI history    Liver disease Father    COPD Father    Prostate cancer Maternal Grandfather    Allergic rhinitis Neg Hx    Angioedema Neg Hx    Asthma Neg Hx    Eczema Neg Hx    Immunodeficiency Neg Hx    Urticaria Neg Hx    Colon cancer Neg Hx     Allergies  Allergen Reactions   Citalopram  Swelling and Other (See Comments)    Syncope    Current Medications:   Current Outpatient Medications:    albuterol  (PROAIR  HFA) 108 (90 Base) MCG/ACT inhaler, Inhale 2 puffs into the lungs every 4 (four) hours as needed for wheezing or shortness of breath., Disp: 1 Inhaler, Rfl: 1   buPROPion  (WELLBUTRIN  XL) 150 MG 24 hr tablet, Take 1 tablet (150 mg total) by mouth every morning., Disp: 30 tablet, Rfl: 2   cetirizine (ZYRTEC) 10 MG tablet, Take 10 mg by mouth daily., Disp: , Rfl:    cyclobenzaprine (FLEXERIL) 5 MG tablet, Take 5 mg by mouth every 8 (eight) hours as needed., Disp: , Rfl:    diphenhydrAMINE (BENADRYL) 25 mg capsule, Take 25 mg by mouth every 6 (six) hours as needed., Disp: , Rfl:    escitalopram (LEXAPRO) 10 MG tablet, Take 10 mg by mouth daily., Disp: , Rfl:    hydrOXYzine  (ATARAX ) 10 MG tablet, Take 1 tablet (10  mg total) by mouth at bedtime and may repeat dose one time if needed., Disp: 30 tablet, Rfl: 0   ibuprofen (ADVIL,MOTRIN) 200 MG tablet, Take 800 mg by mouth every 6 (six) hours as needed for pain (pain). Reported on 02/15/2015, Disp: , Rfl:    loratadine (CLARITIN) 10 MG tablet, Take 10 mg by mouth as needed for allergies., Disp: , Rfl:    SYRINGE-NEEDLE, DISP, 3 ML (B-D 3CC LUER-LOK SYR 25GX1) 25G X 1 3 ML MISC, Use to inject Vit B12., Disp: 7 each, Rfl: 0   tirzepatide (ZEPBOUND) 2.5 MG/0.5ML Pen, Inject 2.5 mg into the skin once a week., Disp: 2 mL, Rfl: 0   clonazePAM (KLONOPIN) 0.5 MG tablet, Take 0.5 mg by mouth at bedtime. (Patient not taking: Reported on 10/08/2023), Disp: , Rfl:    Review of Systems:   Negative unless otherwise specified per HPI.  Vitals:   Vitals:    10/08/23 1113 10/08/23 1150  BP: (!) 150/110 (!) 138/98  Pulse: 78   Temp: (!) 97.2 F (36.2 C)   TempSrc: Temporal   SpO2: 99%   Weight: 270 lb 4 oz (122.6 kg)   Height: 6' (1.829 m)      Body mass index is 36.65 kg/m.  Physical Exam:   Physical Exam Vitals and nursing note reviewed.  Constitutional:      Appearance: He is well-developed.  HENT:     Head: Normocephalic.     Mouth/Throat:     Comments: Tip of tongue with two aphthous ulcers Eyes:     Conjunctiva/sclera: Conjunctivae normal.     Pupils: Pupils are equal, round, and reactive to light.  Pulmonary:     Effort: Pulmonary effort is normal.  Musculoskeletal:        General: Normal range of motion.     Cervical back: Normal range of motion.  Skin:    General: Skin is warm and dry.  Neurological:     Mental Status: He is alert and oriented to person, place, and time.  Psychiatric:        Behavior: Behavior normal.        Thought Content: Thought content normal.        Judgment: Judgment normal.     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 Cannot rule out systemic autoimmune issue -- has been seen by gastroenterology in the past for - Recommend mouth sore rinse for relief. - Consider L-lysine and vitamin B12 supplements. - Discussed topical Ora gel for numbing. - Consider triamcinolone paste  Chronic diarrhea Chronic diarrhea and abdominal pain. Possible Crohn's disease or ulcerative colitis. Hesitant about colonoscopy but open to further evaluation if symptoms persist. - Consider GI specialist referral if symptoms persist. - Monitor response to Zepbound for weight management and GI symptoms.  Obesity Discussed Zepbound for weight management due to intolerance to Wegovy . Better insurance coverage expected. - Initiate Zepbound, monitor tolerance and efficacy. - If started, follow up in 3  month(s)  Hypertension Elevated blood pressure possibly stress-related. No immediate medication planned. Asymptomatic. - Monitor blood pressure at home. - follow up parameters discussed.        Lucie Buttner, PA-C

## 2023-10-08 NOTE — Telephone Encounter (Signed)
 Pharmacy Patient Advocate Encounter   Received notification from Onbase that prior authorization for Zepbound 2.5 mg/0.5 ml pen is required/requested.   Insurance verification completed.   The patient is insured through [place] .   Per test claim:  Qsymia, Phentermine or Diethylpropion is preferred by the insurance.  If suggested medication is appropriate, Please send in a new RX and discontinue this one. If not, please advise as to why it's not appropriate so that we may request a Prior Authorization. Please note, some preferred medications may still require a PA.  If the suggested medications have not been trialed and there are no contraindications to their use, the PA will not be submitted, as it will not be approved.

## 2023-10-13 ENCOUNTER — Encounter: Payer: Self-pay | Admitting: Physician Assistant

## 2023-10-15 ENCOUNTER — Telehealth: Payer: Self-pay | Admitting: *Deleted

## 2023-10-15 ENCOUNTER — Telehealth: Payer: Self-pay

## 2023-10-15 ENCOUNTER — Other Ambulatory Visit (HOSPITAL_COMMUNITY): Payer: Self-pay

## 2023-10-15 NOTE — Telephone Encounter (Signed)
 Pharmacy Patient Advocate Encounter  Received notification from [facility] that Prior Authorization for Zepbound 2.5MG /0.5ML Auto-injectors has been APPROVED from 10/15/23 to 10/14/24   PA #/Case ID/Reference #: [id number]

## 2023-10-15 NOTE — Telephone Encounter (Signed)
 Dietterick, Lauraine, CPhT    10/08/23  2:36 PM Note Pharmacy Patient Advocate Encounter   Received notification from Onbase that prior authorization for Zepbound 2.5 mg/0.5 ml pen is required/requested.   Insurance verification completed.   The patient is insured through [place] .   Per test claim:  Qsymia, Phentermine or Diethylpropion is preferred by the insurance.  If suggested medication is appropriate, Please send in a new RX and discontinue this one. If not, please advise as to why it's not appropriate so that we may request a Prior Authorization. Please note, some preferred medications may still require a PA.  If the suggested medications have not been trialed and there are no contraindications to their use, the PA will not be submitted, as it will not be approved.

## 2023-10-15 NOTE — Telephone Encounter (Signed)
 Pharmacy Patient Advocate Encounter   Received notification from [facility] that prior authorization for Zepbound 2.5MG /0.5ML Auto-injectors is required/requested.   Insurance verification completed.   The patient is insured through [facility].   Per test claim: PA required; PA submitted to above mentioned insurance via Latent Key/confirmation #/EOC [id number] Status is pending

## 2023-10-15 NOTE — Telephone Encounter (Signed)
 Please do PA for Zepbound and see Samantha's message about other medications. Thanks

## 2023-10-15 NOTE — Telephone Encounter (Signed)
 Please see message and advise

## 2023-11-07 ENCOUNTER — Ambulatory Visit (HOSPITAL_BASED_OUTPATIENT_CLINIC_OR_DEPARTMENT_OTHER): Payer: PRIVATE HEALTH INSURANCE | Admitting: Orthopaedic Surgery

## 2023-11-07 ENCOUNTER — Other Ambulatory Visit: Payer: Self-pay | Admitting: Physician Assistant

## 2023-11-07 DIAGNOSIS — M533 Sacrococcygeal disorders, not elsewhere classified: Secondary | ICD-10-CM

## 2023-11-07 NOTE — Progress Notes (Signed)
 Chief Complaint: Left-sided SI pain, right plantar fascia     History of Present Illness:    Andrew Freeman is a 43 y.o. male presents today with ongoing left sided lower back pain.  He has been seen previously at [facility] where an MRI was performed.  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  He is now working more in the urgent care setting with [facility].  He is experiencing pain in the left lower side near the SI joint.  This is painful with most activities including lifting.  He has also been experiencing chronic pain over the right plantar aspect of the heel    PMH/PSH/Family History/Social History/Meds/Allergies:    Past Medical History:  Diagnosis Date   Allergy    seasonal allergies   Moderate persistent asthma 02/15/2015   Past Surgical History:  Procedure Laterality Date   WISDOM TOOTH EXTRACTION  1996   Social History   Socioeconomic History   Marital status: Married    Spouse name: Not on file   Number of children: Not on file   Years of education: Not on file   Highest education level: Not on file  Occupational History   Not on file  Tobacco Use   Smoking status: Never   Smokeless tobacco: Never  Vaping Use   Vaping status: Never Used  Substance and Sexual Activity   Alcohol use: Yes    Comment: socially   Drug use: No   Sexual activity: Yes  Other Topics Concern   Not on file  Social History Narrative   Works at [employer]   Social Drivers of Health   Financial Resource Strain: Not on file  Food Insecurity: Not on file  Transportation Needs: Not on file  Physical Activity: Not on file  Stress: Not on file  Social Connections: Not on file   Family History  Problem Relation Age of Onset   Other Mother        significant GI history   Liver disease Father    COPD Father    Prostate cancer Maternal Grandfather    Allergic rhinitis Neg Hx    Angioedema Neg Hx    Asthma Neg Hx    Eczema Neg  Hx    Immunodeficiency Neg Hx    Urticaria Neg Hx    Colon cancer Neg Hx    Allergies  Allergen Reactions   Citalopram  Swelling and Other (See Comments)    Syncope   Current Outpatient Medications  Medication Sig Dispense Refill   albuterol  (PROAIR  HFA) 108 (90 Base) MCG/ACT inhaler Inhale 2 puffs into the lungs every 4 (four) hours as needed for wheezing or shortness of breath. 1 Inhaler 1   buPROPion  (WELLBUTRIN  XL) 150 MG 24 hr tablet Take 1 tablet (150 mg total) by mouth every morning. 30 tablet 2   cetirizine (ZYRTEC) 10 MG tablet Take 10 mg by mouth daily.     clonazePAM (KLONOPIN) 0.5 MG tablet Take 0.5 mg by mouth at bedtime. (Patient not taking: Reported on 10/08/2023)     cyclobenzaprine (FLEXERIL) 5 MG tablet Take 5 mg by mouth every 8 (eight) hours as needed.     diphenhydrAMINE (BENADRYL) 25 mg capsule Take 25 mg by mouth every 6 (six) hours as needed.     escitalopram (LEXAPRO) 10 MG tablet Take 10 mg by mouth daily.     hydrOXYzine  (ATARAX ) 10 MG tablet Take 1 tablet (  10 mg total) by mouth at bedtime and may repeat dose one time if needed. 30 tablet 0   ibuprofen (ADVIL,MOTRIN) 200 MG tablet Take 800 mg by mouth every 6 (six) hours as needed for pain (pain). Reported on 02/15/2015     loratadine (CLARITIN) 10 MG tablet Take 10 mg by mouth as needed for allergies.     SYRINGE-NEEDLE, DISP, 3 ML (B-D 3CC LUER-LOK SYR 25GX1) 25G X 1 3 ML MISC Use to inject Vit B12. 7 each 0   ZEPBOUND 2.5 MG/0.5ML Pen INJECT 2.5 MG  SUBCUTANEOUSLY ONCE A WEEK 4 mL 0   No current facility-administered medications for this visit.   No results found.  Review of Systems:   A ROS was performed including pertinent positives and negatives as documented in the HPI.  Physical Exam :   Constitutional: NAD and appears stated age Neurological: Alert and oriented Psych: Appropriate affect and cooperative There were no vitals taken for this visit.   Comprehensive Musculoskeletal Exam:     Tenderness of hide at the right medial calcaneus plantar fascia insertion, tenderness about the left SI joint   Imaging:     MRI (lumbar spine): Normal   I personally reviewed and interpreted the radiographs.   Assessment and Plan:   43 y.o. male with evidence of left SI joint type pain as well as degenerative disc disease at the L4-L5 L5-S1 level.  At this time I did discuss that he has not had previous relief from epidural or neuroforaminal injections.  Given this we will plan to refer him to Dr. Burnetta for a left-sided ultrasound-guided SI joint.  I would also like to plan to make a referral to Dr. Eldonna for discussion of possible facet nerve ablation as well  -Return to clinic 3 months for reassessment   I personally saw and evaluated the patient, and participated in the management and treatment plan.  Elspeth Parker, MD Attending Physician, Orthopedic Surgery  This document was dictated using Dragon voice recognition software. A reasonable attempt at proof reading has been made to minimize errors.

## 2023-11-12 ENCOUNTER — Encounter: Payer: Self-pay | Admitting: Radiology

## 2023-11-19 ENCOUNTER — Ambulatory Visit: Payer: PRIVATE HEALTH INSURANCE | Admitting: Sports Medicine

## 2023-11-19 ENCOUNTER — Other Ambulatory Visit: Payer: Self-pay

## 2023-11-19 ENCOUNTER — Encounter: Payer: Self-pay | Admitting: Sports Medicine

## 2023-11-19 DIAGNOSIS — M7918 Myalgia, other site: Secondary | ICD-10-CM

## 2023-11-19 DIAGNOSIS — M533 Sacrococcygeal disorders, not elsewhere classified: Secondary | ICD-10-CM | POA: Diagnosis not present

## 2023-11-19 DIAGNOSIS — M2141 Flat foot [pes planus] (acquired), right foot: Secondary | ICD-10-CM

## 2023-11-19 DIAGNOSIS — M722 Plantar fascial fibromatosis: Secondary | ICD-10-CM

## 2023-11-19 DIAGNOSIS — G8929 Other chronic pain: Secondary | ICD-10-CM

## 2023-11-19 DIAGNOSIS — M2142 Flat foot [pes planus] (acquired), left foot: Secondary | ICD-10-CM

## 2023-11-19 DIAGNOSIS — M545 Low back pain, unspecified: Secondary | ICD-10-CM

## 2023-11-19 NOTE — Progress Notes (Signed)
 Andrew Freeman - 43 y.o. male MRN [id number]  Date of birth: 1980-09-13  Office Visit Note: Visit Date: 11/19/2023 PCP: Job Lukes, PA Referred by: Genelle Standing, MD  Subjective: Chief Complaint  Patient presents with   Lower Back - Pain   HPI: Andrew Freeman is a pleasant 43 y.o. male who presents today for chronic left-sided low back/SI-joint pain, also with R-heel/plantar fasciitis.  Low back/SI-joint -Artist has had an ongoing low back pain, this is localized more so to the left side, pointing near the SI joint region.  He initially was having care at [facility] and did have epidural lumbar injections without relief.  He has not had an SI joint injection, but was recommended for evaluation of this today.  Occasionally the pain will radiate down the posterior hamstring, more of a pain as opposed to true numbness or tingling.  Right heel/PF -has been dealing with right heel pain for few months.  He was initially seen at [facility], he had an injection into the plantar fascia on 08/22/2023, which he states was very painful and did not relieve this pain.  He has been doing some stretching exercises as well as ice/frozen water bottle without much relief.  He is also asking about orthotics.  *Independent note reviewed from [facility] 08/22/2023.  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  Diagnosis likely plantar fasciitis, did proceed with injection.  Pertinent ROS were reviewed with the patient and found to be negative unless otherwise specified above in HPI.   Assessment & Plan: Visit Diagnoses:  1. SI (sacroiliac) pain   2. Chronic left-sided low back pain, unspecified whether sciatica present   3. Left buttock pain   4. Plantar fasciitis of right foot   5. Pes planus of both feet    Plan: Impression is acute on chronic left-sided low back pain with ongoing symptoms and no relief from previous lumbar epidural injections.  More of his symptoms seem to emanate from the  left SI joint region, through shared decision making to proceed with ultrasound-guided SI joint injection.  Patient tolerated well, advised on postinjection protocol.  May use ice/heat or over-the-counter anti-inflammatories as needed.  In terms of his right heel pain, he has pain over the plantar aspect in the setting of pes planus and claw toe deformity.  He is tender over the plantar fascia region, although it is slightly unusual he did not receive any relief from that injection.  We discussed trialing treatment such as extracorporeal shockwave therapy, he is interested in this and we will set him up for 2 treatments and then see what sort of cumulative benefit he receives.  I do think he would benefit from customized orthotics, referral sent to [facility].  I did provide him with handout for both stretching as well as plantar fascia strengthening exercises which she will perform once daily.  I will see him back over the next 1-2 weeks for reevaluation and treatment of shockwave for his plantar fascia.  Meds & Orders: No orders of the defined types were placed in this encounter.   Orders Placed This Encounter  Procedures   US  Guided Needle Placement - No Linked Charges   AMB referral to sports medicine     Procedures: U/S-guided SI-joint injection, Left   After discussion of risk/benefits/indications, informed verbal consent was obtained. A timeout was then performed. The patient was positioned in a prone position on exam room table with a pillow placed under the pelvis for  mild hip flexion. The SI joint area was cleaned and prepped with betadine and alcohol swabs. Sterile ultrasound gel was applied and the ultrasound transducer was placed in an anatomic axial plane over the PSIS, then moved distally over the SI-joint. Using ultrasound guidance, a 22-gauge, 3.5 needle was inserted from a medial to lateral approach utilizing an in-plane approach and directed into the SI-joint. The SI-joint was then injected  with a mixture of 4:2 lidocaine:depomedrol with visualization of the injectate flow into the SI-joint under ultrasound visualization. The patient tolerated the procedure well without immediate complications.       Clinical History: No specialty comments available.  He reports that he has never smoked. He has never used smokeless tobacco. No results for input(s): HGBA1C, LABURIC in the last 8760 hours.  Objective:    Physical Exam  Gen: Well-appearing, in no acute distress; non-toxic CV: Well-perfused. Warm.  Resp: Breathing unlabored on room air; no wheezing. Psych: Fluid speech in conversation; appropriate affect; normal thought process  Ortho Exam - Low back: + TTP over the left SI joint region just inferior to the PSIS.  He does have some tenderness in the left posterior buttock.  There is full range of motion with flexion and extension.  - Right feet: Positive TTP over the plantar aspect of the calcaneus and plantar fascia.  Negative heel squeeze.  This does extend along the plantar aspect of the PF.  There are hammertoe deformities noted of the right foot.  There is mild pes planus bilaterally.  Imaging:  *Outside X-ray imaging:  Anatomical Region Laterality Modality  T-spine -- Computed Radiography  L-spine -- --  Pelvis -- --   Impression  IMPRESSION: No acute abnormality.  Electronically Signed by: Charlie Bathe Narrative  INDICATION: Lifting injury  TECHNIQUE: Frontal and lateral views lumbar spine  FINDINGS: No acute fracture or subluxation. Mild disc space narrowing at L5-S1. No destructive lesion. Procedure Note  Bathe Charlie DEL, MD - 09/12/2016 Formatting of this note might be different from the original. INDICATION: Lifting injury  TECHNIQUE: Frontal and lateral views lumbar spine  FINDINGS: No acute fracture or subluxation. Mild disc space narrowing at L5-S1. No destructive lesion.   IMPRESSION: No acute abnormality.  Electronically Signed by:  Charlie Bathe Exam End: 09/12/16 07:33   Specimen Collected: 09/12/16 08:03 Last Resulted: 09/12/16 08:05  Received From: [facility]  Result Received: 02/21/23 13:20    08/22/23: Impression  1.  No acute fracture. 2.  No dislocation. 3.  Moderate first MTP joint degenerative changes. 4.  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 5.  Calcaneal enthesopathy. Narrative  XR FOOT MINIMUM 3 VIEWS RIGHT, 08/22/2023 8:45 AM  INDICATION: Plantar fascial fibromatosis COMPARISON: None. Procedure Note  Regena Clarke, MD - 08/25/2023 Formatting of this note might be different from the original. XR FOOT MINIMUM 3 VIEWS RIGHT, 08/22/2023 8:45 AM  INDICATION: Plantar fascial fibromatosis COMPARISON: None.  IMPRESSION: 1.  No acute fracture. 2.  No dislocation. 3.  Moderate first MTP joint degenerative changes. 4.  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 5.  Calcaneal enthesopathy. Exam End: 08/22/23 08:45   Specimen Collected: 08/24/23 15:32 Last Resulted: 08/25/23 08:44  Received From: [facility]  Result Received: 10/08/23 11:39    Past Medical/Family/Surgical/Social History: Medications & Allergies reviewed per EMR, new medications updated. Patient Active Problem List   Diagnosis Date Noted   Obesity 02/23/2023   Family history of prostate cancer 02/23/2023   Vitamin B12 deficiency 10/24/2021   Moderate  persistent asthma 02/15/2015   Allergic rhinitis 02/15/2015   Anxiety 07/28/2013   Depression 07/28/2013   Past Medical History:  Diagnosis Date   Allergy    seasonal allergies   Moderate persistent asthma 02/15/2015   Family History  Problem Relation Age of Onset   Other Mother        significant GI history   Liver disease Father    COPD Father    Prostate cancer Maternal Grandfather    Allergic rhinitis Neg Hx    Angioedema Neg Hx    Asthma Neg Hx    Eczema Neg Hx    Immunodeficiency Neg Hx    Urticaria Neg Hx    Colon cancer Neg Hx    Past Surgical History:   Procedure Laterality Date   WISDOM TOOTH EXTRACTION  1996   Social History   Occupational History   Not on file  Tobacco Use   Smoking status: Never   Smokeless tobacco: Never  Vaping Use   Vaping status: Never Used  Substance and Sexual Activity   Alcohol use: Yes    Comment: socially   Drug use: No   Sexual activity: Yes

## 2023-11-19 NOTE — Progress Notes (Signed)
 Patient is here for SI joint injection today.  Patient has also had right heel pain that has not improved with any treatment. His pain is on the bottom of the heel and moves into the arch of his foot. He has tried home treatments including medicine, stretches, change in shoe wear, and insoles. He has also previously had an injection which did not give him any relief.  Patient is not currently taking any medicine to treat his SI joint pain or heel pain.

## 2023-11-23 ENCOUNTER — Ambulatory Visit (INDEPENDENT_AMBULATORY_CARE_PROVIDER_SITE_OTHER): Payer: PRIVATE HEALTH INSURANCE

## 2023-11-23 VITALS — BP 134/94 | Ht 72.0 in | Wt 268.0 lb

## 2023-11-23 DIAGNOSIS — M79672 Pain in left foot: Secondary | ICD-10-CM

## 2023-11-23 DIAGNOSIS — M722 Plantar fascial fibromatosis: Secondary | ICD-10-CM | POA: Diagnosis not present

## 2023-11-23 DIAGNOSIS — M79671 Pain in right foot: Secondary | ICD-10-CM

## 2023-11-26 NOTE — Progress Notes (Signed)
 [facility] A Department of [facility] [place]   PCP: Job Lukes, [place]  CHIEF COMPLAINT: bilateral foot pain; plantar fasciitis on R foot   HPI: Patient is a pleasant 43 y.o. male who presents today for fitting of custom orthotics to assist with bilateral foot pain and right plantar fasciitis.  Patient was referred to us  by Dr. Lonell Sprang.  So far patient has been trying some home exercises, over-the-counter orthotics, and oral NSAIDs for plantar fascia relief.  This has not significantly improved his symptoms.  He does have planned ECSWT coming up soon but is requiring additional cushion and arch support at this time.   PMH:  Past Medical History:  Diagnosis Date   Allergy    seasonal allergies   Moderate persistent asthma 02/15/2015    Patient Active Problem List   Diagnosis Date Noted   Obesity 02/23/2023   Family history of prostate cancer 02/23/2023   Vitamin B12 deficiency 10/24/2021   Moderate persistent asthma 02/15/2015   Allergic rhinitis 02/15/2015   Anxiety 07/28/2013   Depression 07/28/2013    PSurg:  Past Surgical History:  Procedure Laterality Date   WISDOM TOOTH EXTRACTION  1996    Allergies: Citalopram   Meds:  Previous Medications   ALBUTEROL  (PROAIR  HFA) 108 (90 BASE) MCG/ACT INHALER    Inhale 2 puffs into the lungs every 4 (four) hours as needed for wheezing or shortness of breath.   BUPROPION  (WELLBUTRIN  XL) 150 MG 24 HR TABLET    Take 1 tablet (150 mg total) by mouth every morning.   CETIRIZINE (ZYRTEC) 10 MG TABLET    Take 10 mg by mouth daily.   CLONAZEPAM (KLONOPIN) 0.5 MG TABLET    Take 0.5 mg by mouth at bedtime.   CYCLOBENZAPRINE (FLEXERIL) 5 MG TABLET    Take 5 mg by mouth every 8 (eight) hours as needed.   DIPHENHYDRAMINE (BENADRYL) 25 MG CAPSULE    Take 25 mg by mouth every 6 (six) hours as needed.   ESCITALOPRAM (LEXAPRO) 10 MG TABLET    Take 10 mg by mouth daily.   HYDROXYZINE  (ATARAX ) 10 MG  TABLET    Take 1 tablet (10 mg total) by mouth at bedtime and may repeat dose one time if needed.   IBUPROFEN (ADVIL,MOTRIN) 200 MG TABLET    Take 800 mg by mouth every 6 (six) hours as needed for pain (pain). Reported on 02/15/2015   LORATADINE (CLARITIN) 10 MG TABLET    Take 10 mg by mouth as needed for allergies.   SYRINGE-NEEDLE, DISP, 3 ML (B-D 3CC LUER-LOK SYR 25GX1) 25G X 1 3 ML MISC    Use to inject Vit B12.   ZEPBOUND 2.5 MG/0.5ML PEN    INJECT 2.5 MG  SUBCUTANEOUSLY ONCE A WEEK    Social:  Social History   Tobacco Use   Smoking status: Never   Smokeless tobacco: Never  Substance Use Topics   Alcohol use: Yes    Comment: socially    REVIEW OF SYSTEMS:  ROS negative except as noted in HPI above   Objective Exam:  Vitals:   11/23/23 0951  BP: (!) 134/94  Weight: 268 lb (121.6 kg)  Height: 6' (1.829 m)    GENERAL: Patient is afebrile, Vital signs reviewed, well appearing, Patient appears comfortable, Alert and lucid. No apparent distress.   Physical Exam   Ortho Exam:  Bilateral foot exam: Bilaterally collapsed medial longitudinal arch with slight calcaneal valgus.  Splaying of  1st and 2nd toes.  Severe tenderness to palpation over medial calcaneus where plantar fascia inserts on his right foot.  Windlass test positive.  Limited flexibility with dorsiflexion of both feet.  Patient overpronates bilaterally on ambulation assessment.  RESULTS:  Labs: No results found for this or any previous visit (from the past 48 hours).  Imaging:  No orders to display    Assessment/Plan:  1. Plantar fasciitis of right foot   2. Bilateral foot pain    Patient was fitted for a : Fit 'n Run semi-rigid orthotic  The orthotic was heated, placed on the orthotic stand. The patient was positioned in subtalar neutral position and 10 degrees of ankle dorsiflexion in a weight bearing stance on the heated orthotic blank After completion of molding Blank: Fit 'n Run - size  12 Posting:  none added Base: none added Patient endorsed increased comfort after fitting of custom orthotics.  He also has correction of overpronation on ambulation assessment while wearing custom orthotics - Patient will follow-up for shockwave therapy with Dr. Burnetta for further treatment of plantar fasciitis.  No further questions or concerns at this time.  New Prescriptions   No medications on file    Medications, medical history, allergies, surgical history, hospitalizations, family history, social history, ROS and vitals entered by nursing staff and reviewed by myself.  I discussed with the patient the diagnosis, treatment plan, indications for return to the emergency department, and for expected follow-up. The patient verbalized an understanding. The patient is asked if there are any questions or concerns. We discuss the case, until all issues are addressed to the patient's satisfaction.  Follow up per instructions including returning for additional office visit if symptoms worsen or proceeding to the emergency department or urgent care in the next 12-24hrs if there is an acute concerning increasing symptoms, pain, fevers, or other symptoms.  Prentice Agent, DO  5:34 PM, 11/26/2023

## 2023-11-28 ENCOUNTER — Encounter: Payer: Self-pay | Admitting: Sports Medicine

## 2023-11-28 ENCOUNTER — Ambulatory Visit: Payer: PRIVATE HEALTH INSURANCE | Admitting: Sports Medicine

## 2023-11-28 DIAGNOSIS — M2141 Flat foot [pes planus] (acquired), right foot: Secondary | ICD-10-CM

## 2023-11-28 DIAGNOSIS — M722 Plantar fascial fibromatosis: Secondary | ICD-10-CM

## 2023-11-28 DIAGNOSIS — M533 Sacrococcygeal disorders, not elsewhere classified: Secondary | ICD-10-CM | POA: Diagnosis not present

## 2023-11-28 DIAGNOSIS — M2142 Flat foot [pes planus] (acquired), left foot: Secondary | ICD-10-CM

## 2023-11-28 NOTE — Progress Notes (Signed)
 Patient says he did not get any relief from the SI joint injection. He has been fitted for custom orthotics, which do seem to be helping with his pain while walking when he is wearing them. He is here for first trial of shockwave therapy today, and does have a second appointment scheduled.

## 2023-11-28 NOTE — Progress Notes (Signed)
 Andrew Freeman - 43 y.o. male MRN [id number]  Date of birth: [birth year]  Office Visit Note: Visit Date: 11/28/2023 PCP: Andrew Lukes, [place] Referred by: Andrew Freeman, [place]  Subjective: Chief Complaint  Patient presents with   Right Foot - Pain   HPI: Andrew Freeman is a pleasant 43 y.o. male who presents today for evaluation and treatment of right plantar fasciitis, follow-up SI joint pain.  Right plantar fascia/feet - he did get fitted for customized orthotics last week.  He finds these comfortable and does notice a difference with this already.  He is open to trialing shockwave therapy today.  He is doing some of his plantar fascia rehab exercises we gave at last visit.  SI-joint -we did perform ultrasound-guided SI joint injection back in 11/19/2023.  He was on oral prednisone at this time as well, he felt very good for a few days but then 1 to 2 days after stopping oral prednisone his pain returned.  He still has a similar pain.  Pertinent ROS were reviewed with the patient and found to be negative unless otherwise specified above in HPI.   Assessment & Plan: Visit Diagnoses:  1. Plantar fasciitis of right foot   2. Pes planus of both feet   3. SI (sacroiliac) pain    Plan: Impression is right foot plantar fasciitis in the setting of pes planus.  He has found benefit from custom orthotics to help support his longitudinal arch and offload the plantar fascia, will continue these in his shoes/boots.  We did perform our first trial of extracorporeal shockwave therapy, patient tolerated well.  We will plan for 2-3 treatments and then reevaluate what cumulative benefit he is receiving.  Will continue his home rehab stretches and PT for the plantar fascia on a daily basis.  Unfortunately he did not receive long-lasting relief from SI joint injection.  Will see how this does going forward, may have him follow back up with Dr. Genelle -he did have a referral to Dr. Eldonna for facet nerve  ablation, may ascertain this as well.  Follow-up next week shockwave trial and reevaluation.  Follow-up: Return in about 1 week (around 12/05/2023).   Meds & Orders: No orders of the defined types were placed in this encounter.  No orders of the defined types were placed in this encounter.    Procedures: Procedure: ECSWT Indications: Plantar fasciitis   Procedure Details Consent: Risks of procedure as well as the alternatives and risks of each were explained to the patient.  Verbal consent for procedure obtained. Time Out: Verified patient identification, verified procedure, site was marked, verified correct patient position. The area was cleaned with alcohol swab.     The right plantar fascia was targeted for Extracorporeal shockwave therapy.    Preset: Plantar fasciitis Power Level: 90 - 110 mJ Frequency: 10-12 Hz Impulse/cycles: 2000 Head size: Regular   Patient tolerated procedure well without immediate complications.       Clinical History: No specialty comments available.  He reports that he has never smoked. He has never used smokeless tobacco. No results for input(s): HGBA1C, LABURIC in the last 8760 hours.  Objective:    Physical Exam  Gen: Well-appearing, in no acute distress; non-toxic CV: Well-perfused. Warm.  Resp: Breathing unlabored on room air; no wheezing. Psych: Fluid speech in conversation; appropriate affect; normal thought process  Ortho Exam - Right foot: Positive TTP over the medial band of the plantar fascia at the insertion and just distal to  the calcaneus.  There is notable pes planus.  Hammertoe deformities noted on the right foot.  Imaging: No results found.  Past Medical/Family/Surgical/Social History: Medications & Allergies reviewed per EMR, new medications updated. Patient Active Problem List   Diagnosis Date Noted   Obesity 02/23/2023   Family history of prostate cancer 02/23/2023   Vitamin B12 deficiency 10/24/2021   Moderate  persistent asthma 02/15/2015   Allergic rhinitis 02/15/2015   Anxiety 07/28/2013   Depression 07/28/2013   Past Medical History:  Diagnosis Date   Allergy    seasonal allergies   Moderate persistent asthma 02/15/2015   Family History  Problem Relation Age of Onset   Other Mother        significant GI history   Liver disease Father    COPD Father    Prostate cancer Maternal Grandfather    Allergic rhinitis Neg Hx    Angioedema Neg Hx    Asthma Neg Hx    Eczema Neg Hx    Immunodeficiency Neg Hx    Urticaria Neg Hx    Colon cancer Neg Hx    Past Surgical History:  Procedure Laterality Date   WISDOM TOOTH EXTRACTION  1996   Social History   Occupational History   Not on file  Tobacco Use   Smoking status: Never   Smokeless tobacco: Never  Vaping Use   Vaping status: Never Used  Substance and Sexual Activity   Alcohol use: Yes    Comment: socially   Drug use: No   Sexual activity: Yes

## 2023-12-01 ENCOUNTER — Encounter: Payer: Self-pay | Admitting: Physician Assistant

## 2023-12-03 MED ORDER — ZEPBOUND 5 MG/0.5ML ~~LOC~~ SOAJ: 5.0000 mg | SUBCUTANEOUS | 0 refills | Status: DC

## 2023-12-11 ENCOUNTER — Ambulatory Visit: Payer: PRIVATE HEALTH INSURANCE | Admitting: Sports Medicine

## 2023-12-13 ENCOUNTER — Encounter (HOSPITAL_BASED_OUTPATIENT_CLINIC_OR_DEPARTMENT_OTHER): Payer: Self-pay | Admitting: Orthopaedic Surgery

## 2023-12-14 ENCOUNTER — Other Ambulatory Visit (HOSPITAL_BASED_OUTPATIENT_CLINIC_OR_DEPARTMENT_OTHER): Payer: Self-pay | Admitting: Orthopaedic Surgery

## 2023-12-14 DIAGNOSIS — M545 Low back pain, unspecified: Secondary | ICD-10-CM

## 2024-01-05 ENCOUNTER — Other Ambulatory Visit: Payer: Self-pay | Admitting: Physician Assistant

## 2024-01-08 ENCOUNTER — Other Ambulatory Visit (HOSPITAL_COMMUNITY): Payer: Self-pay

## 2024-01-10 ENCOUNTER — Encounter: Payer: Self-pay | Admitting: Physician Assistant

## 2024-01-11 MED ORDER — ZEPBOUND 5 MG/0.5ML ~~LOC~~ SOAJ: 5.0000 mg | SUBCUTANEOUS | 0 refills | Status: DC

## 2024-01-11 NOTE — Telephone Encounter (Signed)
 Refill has been sent in. Patient notified via MyChart.

## 2024-02-02 ENCOUNTER — Encounter: Payer: Self-pay | Admitting: Physician Assistant

## 2024-02-04 MED ORDER — ZEPBOUND 7.5 MG/0.5ML ~~LOC~~ SOAJ: 7.5000 mg | SUBCUTANEOUS | 0 refills | Status: AC
